# Patient Record
Sex: Female | Born: 2000 | Race: White | Hispanic: No | Smoking: Current every day smoker
Health system: Southern US, Community
[De-identification: ages and names within clinical notes are randomized; demographics above are authoritative.]

## PROBLEM LIST (undated history)

## (undated) DIAGNOSIS — F329 Major depressive disorder, single episode, unspecified: Secondary | ICD-10-CM

## (undated) DIAGNOSIS — G43909 Migraine, unspecified, not intractable, without status migrainosus: Secondary | ICD-10-CM

## (undated) DIAGNOSIS — F603 Borderline personality disorder: Secondary | ICD-10-CM

## (undated) DIAGNOSIS — T50902A Poisoning by unspecified drugs, medicaments and biological substances, intentional self-harm, initial encounter: Secondary | ICD-10-CM

## (undated) HISTORY — PX: APPENDECTOMY: SHX54

---

## 2018-12-06 ENCOUNTER — Emergency Department: Payer: Medicaid - Out of State

## 2018-12-06 ENCOUNTER — Encounter: Payer: Self-pay | Admitting: Emergency Medicine

## 2018-12-06 ENCOUNTER — Other Ambulatory Visit: Payer: Self-pay

## 2018-12-06 ENCOUNTER — Inpatient Hospital Stay
Admission: EM | Admit: 2018-12-06 | Discharge: 2018-12-08 | DRG: 101 | Disposition: A | Discharge status: Home / Self Care | Payer: Medicaid - Out of State | Attending: Internal Medicine | Admitting: Internal Medicine

## 2018-12-06 DIAGNOSIS — Z1159 Encounter for screening for other viral diseases: Secondary | ICD-10-CM

## 2018-12-06 DIAGNOSIS — E236 Other disorders of pituitary gland: Secondary | ICD-10-CM

## 2018-12-06 DIAGNOSIS — F17200 Nicotine dependence, unspecified, uncomplicated: Secondary | ICD-10-CM | POA: Diagnosis present

## 2018-12-06 DIAGNOSIS — G4089 Other seizures: Principal | ICD-10-CM | POA: Diagnosis present

## 2018-12-06 DIAGNOSIS — F319 Bipolar disorder, unspecified: Secondary | ICD-10-CM | POA: Diagnosis present

## 2018-12-06 DIAGNOSIS — R569 Unspecified convulsions: Secondary | ICD-10-CM

## 2018-12-06 LAB — CBC WITH DIFFERENTIAL/PLATELET
Abs Immature Granulocytes: 0.03 10*3/uL (ref 0.00–0.07)
Basophils Absolute: 0 10*3/uL (ref 0.0–0.1)
Basophils Relative: 0 %
Eosinophils Absolute: 0.1 10*3/uL (ref 0.0–0.5)
Eosinophils Relative: 1 %
HCT: 38.6 % (ref 36.0–46.0)
Hemoglobin: 12.8 g/dL (ref 12.0–15.0)
Immature Granulocytes: 0 %
Lymphocytes Relative: 21 %
Lymphs Abs: 2.2 10*3/uL (ref 0.7–4.0)
MCH: 31.4 pg (ref 26.0–34.0)
MCHC: 33.2 g/dL (ref 30.0–36.0)
MCV: 94.6 fL (ref 80.0–100.0)
Monocytes Absolute: 0.6 10*3/uL (ref 0.1–1.0)
Monocytes Relative: 6 %
Neutro Abs: 7.1 10*3/uL (ref 1.7–7.7)
Neutrophils Relative %: 72 %
Platelets: 200 10*3/uL (ref 150–400)
RBC: 4.08 MIL/uL (ref 3.87–5.11)
RDW: 12.1 % (ref 11.5–15.5)
WBC: 10.1 10*3/uL (ref 4.0–10.5)
nRBC: 0 % (ref 0.0–0.2)

## 2018-12-06 LAB — URINALYSIS, COMPLETE (UACMP) WITH MICROSCOPIC
Bilirubin Urine: NEGATIVE
Glucose, UA: NEGATIVE mg/dL
Hgb urine dipstick: NEGATIVE
Ketones, ur: NEGATIVE mg/dL
Leukocytes,Ua: NEGATIVE
Nitrite: NEGATIVE
Protein, ur: NEGATIVE mg/dL
Specific Gravity, Urine: 1.02 (ref 1.005–1.030)
WBC, UA: NONE SEEN WBC/hpf (ref 0–5)
pH: 7 (ref 5.0–8.0)

## 2018-12-06 LAB — COMPREHENSIVE METABOLIC PANEL
ALT: 13 U/L (ref 0–44)
AST: 16 U/L (ref 15–41)
Albumin: 4 g/dL (ref 3.5–5.0)
Alkaline Phosphatase: 75 U/L (ref 38–126)
Anion gap: 7 (ref 5–15)
BUN: 16 mg/dL (ref 6–20)
CO2: 26 mmol/L (ref 22–32)
Calcium: 9.1 mg/dL (ref 8.9–10.3)
Chloride: 105 mmol/L (ref 98–111)
Creatinine, Ser: 0.62 mg/dL (ref 0.44–1.00)
GFR calc Af Amer: >60 mL/min (ref >60)
GFR calc non Af Amer: >60 mL/min (ref >60)
Glucose, Bld: 107 mg/dL — ABNORMAL HIGH (ref 70–99)
Potassium: 3.7 mmol/L (ref 3.5–5.1)
Sodium: 138 mmol/L (ref 135–145)
Total Bilirubin: 0.4 mg/dL (ref 0.3–1.2)
Total Protein: 7.2 g/dL (ref 6.5–8.1)

## 2018-12-06 LAB — POCT PREGNANCY, URINE: Preg Test, Ur: NEGATIVE

## 2018-12-06 NOTE — ED Notes (Signed)
IV attempt to rt hand and left a/c without success; pt tolerated well; will have 2nd RN examine for access; pt voices good understanding and agreeance

## 2018-12-06 NOTE — ED Triage Notes (Signed)
Pt to room 3 via EMS from home; stands and tx self to stretcher; EMS st per household member--witness 5-30min seizure activity; no hx of such

## 2018-12-06 NOTE — ED Provider Notes (Signed)
[facility] Emergency Department Provider Note   ____________________________________________   First MD Initiated Contact with Patient 12/06/18 2356     (approximate)  I have reviewed the triage vital signs and the nursing notes.   HISTORY  Chief Complaint Seizure  Level V caveat: Limited by postictal state  HPI Natalie Pham is a 18 y.o. female brought to the ED from home via EMS status post seizure.  Per patient's girlfriend who witnessed the event, they were relaxing on the couch when patient complained of not feeling well and had a series of tonic-clonic seizures lasting approximately 5 minutes.  Patient was postictal afterwards but by the time EMS arrived girlfriend states patient was in her baseline state.  Patient does not have a history of seizure disorder.  Girlfriend states a similar episode occurred last week during patient's work.  She was on face time with the patient at the time when she experienced a several minute episode of tonic-clonic seizure.  Her coworkers lowered her to the floor.  She was evaluated by the ED in [place] and diagnosed with complex migraine.  Patient states that she has never been diagnosed with migraines previously.  Denies recent fever, cough, chest pain, shortness of breath, abdominal pain, nausea or vomiting.  Currently has slight frontal headache.  Denies recent travel, trauma or exposure to persons diagnosed with coronavirus.       Past medical history None  There are no active problems to display for this patient.    Prior to Admission medications   Not on File    Allergies Abilify [aripiprazole]  No family history on file.  Social History Social History   Tobacco Use  . Smoking status: Current Every Day Smoker  . Smokeless tobacco: Never Used  Substance Use Topics  . Alcohol use: Never    Frequency: Never  . Drug use: Never  Denies illicit drug use  Review of Systems  Constitutional: No  fever/chills Eyes: No visual changes. ENT: No sore throat. Cardiovascular: Denies chest pain. Respiratory: Denies shortness of breath. Gastrointestinal: No abdominal pain.  No nausea, no vomiting.  No diarrhea.  No constipation. Genitourinary: Negative for dysuria. Musculoskeletal: Negative for back pain. Skin: Negative for rash. Neurological: Positive for headache. Negative for focal weakness or numbness.   ____________________________________________   PHYSICAL EXAM:  VITAL SIGNS: ED Triage Vitals  Enc Vitals Group     BP 12/06/18 2155 (!) 155/78     Pulse Rate 12/06/18 2155 75     Resp 12/06/18 2200 18     Temp 12/06/18 2155 98.3 F (36.8 C)     Temp Source 12/06/18 2155 Oral     SpO2 12/06/18 2155 99 %     Weight 12/06/18 2156 225 lb (102.1 kg)     Height 12/06/18 2156 5\' 2"  (1.575 m)     Head Circumference --      Peak Flow --      Pain Score 12/06/18 2158 7     Pain Loc --      Pain Edu? --      Excl. in GC? --     Constitutional: Alert and oriented. Well appearing and in no acute distress. Eyes: Conjunctivae are normal. PERRL. EOMI. Head: Atraumatic. Nose: Atraumatic. Mouth/Throat: Mucous membranes are moist.  No dental malocclusion. Neck: No stridor.  No cervical spine tenderness to palpation.  No carotid bruits. Cardiovascular: Normal rate, regular rhythm. Grossly normal heart sounds.  Good peripheral circulation. Respiratory: Normal respiratory effort.  No retractions. Lungs CTAB. Gastrointestinal: Soft and nontender. No distention. No abdominal bruits. No CVA tenderness. Musculoskeletal: No lower extremity tenderness nor edema.  No joint effusions. Neurologic: Alert and oriented x3.  CN II-XII grossly intact. Normal speech and language. No gross focal neurologic deficits are appreciated.  Skin:  Skin is warm, dry and intact. No rash noted. No petechiae. Psychiatric: Mood and affect are normal. Speech and behavior are normal.   ____________________________________________   LABS (all labs ordered are listed, but only abnormal results are displayed)  Labs Reviewed  URINALYSIS, COMPLETE (UACMP) WITH MICROSCOPIC - Abnormal; Notable for the following components:      Result Value   Color, Urine YELLOW (*)    APPearance CLOUDY (*)    Bacteria, UA RARE (*)    All other components within normal limits  COMPREHENSIVE METABOLIC PANEL - Abnormal; Notable for the following components:   Glucose, Bld 107 (*)    All other components within normal limits  SARS CORONAVIRUS 2 (HOSPITAL ORDER, PERFORMED IN [facility])  CBC WITH DIFFERENTIAL/PLATELET  URINE DRUG SCREEN, QUALITATIVE ([facility] ONLY)  POC URINE PREG, ED  POCT PREGNANCY, URINE   ____________________________________________  EKG  ED ECG REPORT I, SUNG,JADE J, the attending physician, personally viewed and interpreted this ECG.   Date: 12/07/2018  EKG Time: 2202  Rate: 91  Rhythm: normal EKG, normal sinus rhythm  Axis: Normal  Intervals:none  ST&T Change: Nonspecific  ____________________________________________  RADIOLOGY  ED MD interpretation: No ICH, empty sella turcica  Official radiology report(s): Ct Head Wo Contrast  Result Date: 12/06/2018 CLINICAL DATA:  18 y/o  F; Seizure, new, nontraumatic, 18-40 yrs. EXAM: CT HEAD WITHOUT CONTRAST TECHNIQUE: Contiguous axial images were obtained from the base of the skull through the vertex without intravenous contrast. COMPARISON:  None. FINDINGS: Brain: No evidence of acute infarction, hemorrhage, hydrocephalus, extra-axial collection or mass lesion/mass effect. Empty sella turcica. Vascular: No hyperdense vessel or unexpected calcification. Skull: Normal. Negative for fracture or focal lesion. Sinuses/Orbits: No acute finding. Other: None. IMPRESSION: No acute intracranial abnormality identified. Empty sella turcica. Otherwise unremarkable CT of the head. Electronically Signed   By: Mitzi Hansen M.D.   On: 12/06/2018 22:53    ____________________________________________   PROCEDURES  Procedure(s) performed (including Critical Care):  Procedures   ____________________________________________   INITIAL IMPRESSION / ASSESSMENT AND PLAN / ED COURSE  As part of my medical decision making, I reviewed the following data within the electronic MEDICAL RECORD NUMBER History obtained from family, Nursing notes reviewed and incorporated, Labs reviewed, EKG interpreted, Old chart reviewed, Radiograph reviewed, Discussed with admitting physician Dr. Arville Care and Notes from prior ED visits     Natalie Pham was evaluated in Emergency Department on 12/07/2018 for the symptoms described in the history of present illness. She was evaluated in the context of the global COVID-19 pandemic, which necessitated consideration that the patient might be at risk for infection with the SARS-CoV-2 virus that causes COVID-19. Institutional protocols and algorithms that pertain to the evaluation of patients at risk for COVID-19 are in a state of rapid change based on information released by regulatory bodies including the CDC and federal and state organizations. These policies and algorithms were followed during the patient's care in the ED.   18 year old female who presents with new onset seizures, now twice in 2 weeks. Differential diagnosis includes, but is not limited to, intracranial hemorrhage, meningitis/encephalitis, previous head trauma, cavernous venous thrombosis, tension headache, temporal arteritis, migraine or migraine equivalent,  idiopathic intracranial hypertension, and substance use.  Laboratory results unremarkable.  CT finding of empty sella turcica.  Given patient has no prior history of seizures, and now has had 2 seizures in 2 weeks, will discuss with hospitalist to evaluate emergency department for admission for further work-up.      ____________________________________________    FINAL CLINICAL IMPRESSION(S) / ED DIAGNOSES  Final diagnoses:  Seizure (HCC)  Empty sella [facility](HCC)     ED Discharge Orders    None       Note:  This document was prepared using Dragon voice recognition software and may include unintentional dictation errors.   Irean HongSung, Jade J, MD 12/07/18 [phone]

## 2018-12-06 NOTE — ED Notes (Signed)
Pt to CT via stretcher accomp by CT tech 

## 2018-12-06 NOTE — ED Notes (Addendum)
Pt assisted into hosp gown & on card monitor; pt reports no memory of incident PTA; denies any recent illness; st she awoke with people standing over her; c/o generalized HA accomp by photosensitivity and dizziness; denies hx of same; pt reports her roommate is a nurse and she was told that she thought she had a seizure; pt A&Ox3, PERRL, MAEW, grips = & strong; resp even/unlab, lungs clear, apical audible & regular, +BS, abd soft/nondist, strong & = periph pulses

## 2018-12-07 ENCOUNTER — Inpatient Hospital Stay: Payer: Medicaid - Out of State

## 2018-12-07 ENCOUNTER — Encounter: Payer: Self-pay | Admitting: Emergency Medicine

## 2018-12-07 DIAGNOSIS — F17200 Nicotine dependence, unspecified, uncomplicated: Secondary | ICD-10-CM | POA: Diagnosis present

## 2018-12-07 DIAGNOSIS — R569 Unspecified convulsions: Secondary | ICD-10-CM | POA: Diagnosis not present

## 2018-12-07 DIAGNOSIS — G4089 Other seizures: Secondary | ICD-10-CM | POA: Diagnosis present

## 2018-12-07 DIAGNOSIS — E236 Other disorders of pituitary gland: Secondary | ICD-10-CM | POA: Diagnosis present

## 2018-12-07 DIAGNOSIS — Z1159 Encounter for screening for other viral diseases: Secondary | ICD-10-CM | POA: Diagnosis not present

## 2018-12-07 DIAGNOSIS — F319 Bipolar disorder, unspecified: Secondary | ICD-10-CM | POA: Diagnosis present

## 2018-12-07 LAB — URINE DRUG SCREEN, QUALITATIVE (ARMC ONLY)
Amphetamines, Ur Screen: NOT DETECTED
Barbiturates, Ur Screen: NOT DETECTED
Benzodiazepine, Ur Scrn: NOT DETECTED
Cannabinoid 50 Ng, Ur ~~LOC~~: NOT DETECTED
Cocaine Metabolite,Ur ~~LOC~~: NOT DETECTED
MDMA (Ecstasy)Ur Screen: NOT DETECTED
Methadone Scn, Ur: NOT DETECTED
Opiate, Ur Screen: NOT DETECTED
Phencyclidine (PCP) Ur S: NOT DETECTED
Tricyclic, Ur Screen: NOT DETECTED

## 2018-12-07 LAB — MAGNESIUM: Magnesium: 2 mg/dL (ref 1.7–2.4)

## 2018-12-07 LAB — CBC
HCT: 37.3 % (ref 36.0–46.0)
Hemoglobin: 12.1 g/dL (ref 12.0–15.0)
MCH: 30.9 pg (ref 26.0–34.0)
MCHC: 32.4 g/dL (ref 30.0–36.0)
MCV: 95.4 fL (ref 80.0–100.0)
Platelets: 194 10*3/uL (ref 150–400)
RBC: 3.91 MIL/uL (ref 3.87–5.11)
RDW: 12.1 % (ref 11.5–15.5)
WBC: 9.1 10*3/uL (ref 4.0–10.5)
nRBC: 0 % (ref 0.0–0.2)

## 2018-12-07 LAB — BASIC METABOLIC PANEL
Anion gap: 8 (ref 5–15)
BUN: 16 mg/dL (ref 6–20)
CO2: 22 mmol/L (ref 22–32)
Calcium: 8.6 mg/dL — ABNORMAL LOW (ref 8.9–10.3)
Chloride: 108 mmol/L (ref 98–111)
Creatinine, Ser: 0.54 mg/dL (ref 0.44–1.00)
GFR calc Af Amer: >60 mL/min (ref >60)
GFR calc non Af Amer: >60 mL/min (ref >60)
Glucose, Bld: 108 mg/dL — ABNORMAL HIGH (ref 70–99)
Potassium: 3.7 mmol/L (ref 3.5–5.1)
Sodium: 138 mmol/L (ref 135–145)

## 2018-12-07 LAB — SARS CORONAVIRUS 2 BY RT PCR (HOSPITAL ORDER, PERFORMED IN ~~LOC~~ HOSPITAL LAB): SARS Coronavirus 2: NEGATIVE

## 2018-12-07 LAB — PHOSPHORUS: Phosphorus: 4.2 mg/dL (ref 2.5–4.6)

## 2018-12-07 MED ORDER — ENOXAPARIN SODIUM 40 MG/0.4ML ~~LOC~~ SOLN: 40.0000 mg | SUBCUTANEOUS | Status: DC

## 2018-12-07 MED ORDER — SODIUM CHLORIDE 0.9 % IV SOLN
INTRAVENOUS | Status: DC
  Administered 2018-12-07: 03:00:00 via INTRAVENOUS

## 2018-12-07 MED ORDER — LORAZEPAM 2 MG/ML IJ SOLN: 1.0000 mg | Freq: Two times a day (BID) | INTRAMUSCULAR | Status: DC | PRN

## 2018-12-07 MED ORDER — ENOXAPARIN SODIUM 40 MG/0.4ML ~~LOC~~ SOLN: 40.0000 mg | Freq: Two times a day (BID) | SUBCUTANEOUS | Status: DC

## 2018-12-07 MED ORDER — LEVETIRACETAM 500 MG PO TABS
500.0000 mg | ORAL_TABLET | Freq: Two times a day (BID) | ORAL | Status: DC
  Administered 2018-12-07 – 2018-12-08 (×2): 500 mg via ORAL
  Filled 2018-12-07 (×3): qty 1

## 2018-12-07 MED ORDER — TRAZODONE HCL 50 MG PO TABS
25.0000 mg | ORAL_TABLET | Freq: Every evening | ORAL | Status: DC | PRN
  Administered 2018-12-07: 21:00:00 25 mg via ORAL
  Filled 2018-12-07: qty 1

## 2018-12-07 MED ORDER — ACETAMINOPHEN 325 MG PO TABS
650.0000 mg | ORAL_TABLET | Freq: Four times a day (QID) | ORAL | Status: DC | PRN
  Administered 2018-12-07: 03:00:00 650 mg via ORAL
  Filled 2018-12-07: qty 2

## 2018-12-07 MED ORDER — LEVETIRACETAM IN NACL 500 MG/100ML IV SOLN
500.0000 mg | Freq: Once | INTRAVENOUS | Status: AC
  Administered 2018-12-07: 12:00:00 500 mg via INTRAVENOUS
  Filled 2018-12-07: qty 100

## 2018-12-07 MED ORDER — LORAZEPAM 2 MG/ML IJ SOLN
0.5000 mg | Freq: Once | INTRAMUSCULAR | Status: AC
  Administered 2018-12-07: 0.5 mg via INTRAVENOUS
  Filled 2018-12-07: qty 1

## 2018-12-07 MED ORDER — ONDANSETRON HCL 4 MG/2ML IJ SOLN: 4.0000 mg | Freq: Four times a day (QID) | INTRAMUSCULAR | Status: DC | PRN

## 2018-12-07 MED ORDER — ACETAMINOPHEN 650 MG RE SUPP: 650.0000 mg | Freq: Four times a day (QID) | RECTAL | Status: DC | PRN

## 2018-12-07 MED ORDER — MAGNESIUM HYDROXIDE 400 MG/5ML PO SUSP
30.0000 mL | Freq: Every day | ORAL | Status: DC | PRN
  Filled 2018-12-07: qty 30

## 2018-12-07 MED ORDER — SODIUM CHLORIDE 0.9 % IV BOLUS
500.0000 mL | Freq: Once | INTRAVENOUS | Status: AC
  Administered 2018-12-07: 500 mL via INTRAVENOUS

## 2018-12-07 MED ORDER — ONDANSETRON HCL 4 MG PO TABS: 4.0000 mg | ORAL_TABLET | Freq: Four times a day (QID) | ORAL | Status: DC | PRN

## 2018-12-07 MED ORDER — VENLAFAXINE HCL ER 37.5 MG PO CP24
37.5000 mg | ORAL_CAPSULE | Freq: Every day | ORAL | Status: DC
  Administered 2018-12-07 – 2018-12-08 (×2): 37.5 mg via ORAL
  Filled 2018-12-07 (×2): qty 1

## 2018-12-07 NOTE — ED Notes (Signed)
hospitalist in to see pt.

## 2018-12-07 NOTE — Consult Note (Signed)
Reason for Consult:Seizures Referring Physician: Allena Katz  CC: Seizures  HPI: Natalie Pham is an 18 y.o. female with a history of bipolar disease and PTSD who is amnestic of most of the events leading to her hospitalization.  Per chart patient was relaxing with girlfriend on the couch when patient complained of not feeling well and had a series of tonic-clonic seizures lasting approximately 5 minutes.  There was no tongue biting or bowel/bladder incontinence per patient.  Patient was postictal afterwards but by the time EMS arrived girlfriend stated patient was in her baseline state.  Patient does not have a history of seizure disorder.  Patient reports that about a week ago she had a 15 minutes episode of being unresponsive at her job.  She  Reports asking her co-workers what happened and that they would not tell her.  Her girlfriend reported though that she was on face time with the patient at the time when she experienced a several minute episode of tonic-clonic seizure.  Her coworkers lowered her to the floor.  She was evaluated by the ED in [place] and diagnosed with complex migraine and was not started on anticonvulsant therapy.   Patient admits to significant current stressors.  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    History reviewed. No pertinent past medical history.  Past Surgical History:  Procedure Laterality Date  . APPENDECTOMY      Family history: No family history of seizures  Social History:  reports that she has been smoking. She has never used smokeless tobacco. She reports that she does not drink alcohol or use drugs.  Allergies  Allergen Reactions  . Abilify [Aripiprazole] Rash    Medications:  I have reviewed the patient's current medications. Prior to Admission:  Medications Prior to Admission  Medication Sig Dispense Refill Last Dose  . Desvenlafaxine Succinate ER (PRISTIQ) 25 MG TB24 Take 1 tablet by mouth daily.   12/06/2018 at 0800  . hydrOXYzine (ATARAX/VISTARIL) 25  MG tablet Take 25 mg by mouth 3 (three) times daily as needed.   prn at prn  . triamcinolone cream (KENALOG) 0.5 % Apply 1 application topically 2 (two) times daily. Use 1 application twice daily for 14 days on affected area.   12/06/2018 at 0800   Scheduled: . enoxaparin (LOVENOX) injection  40 mg Subcutaneous Q12H  . venlafaxine XR  37.5 mg Oral Q breakfast    ROS: History obtained from the patient  General ROS: negative for - chills, fatigue, fever, night sweats, weight gain or weight loss Psychological ROS: negative for - behavioral disorder, hallucinations, memory difficulties, mood swings or suicidal ideation Ophthalmic ROS: negative for - blurry vision, double vision, eye pain or loss of vision ENT ROS: negative for - epistaxis, nasal discharge, oral lesions, sore throat, tinnitus or vertigo Allergy and Immunology ROS: negative for - hives or itchy/watery eyes Hematological and Lymphatic ROS: negative for - bleeding problems, bruising or swollen lymph nodes Endocrine ROS: negative for - galactorrhea, hair pattern changes, polydipsia/polyuria or temperature intolerance Respiratory ROS: negative for - cough, hemoptysis, shortness of breath or wheezing Cardiovascular ROS: negative for - chest pain, dyspnea on exertion, edema or irregular heartbeat Gastrointestinal ROS: negative for - abdominal pain, diarrhea, hematemesis, nausea/vomiting or stool incontinence Genito-Urinary ROS: negative for - dysuria, hematuria, incontinence or urinary frequency/urgency Musculoskeletal ROS: negative for - joint swelling or muscular weakness Neurological ROS: as noted in HPI Dermatological ROS: negative for rash and skin lesion changes  Physical Examination: Blood pressure 115/60, pulse  77, temperature 98.1 F (36.7 C), temperature source Oral, resp. rate 17, height 5\' 2"  (1.575 m), weight 102.1 kg, last menstrual period 11/06/2018, SpO2 100 %.  HEENT-  Normocephalic, no lesions, without obvious  abnormality.  Normal external eye and conjunctiva.  Normal TM's bilaterally.  Normal auditory canals and external ears. Normal external nose, mucus membranes and septum.  Normal pharynx. Cardiovascular- S1, S2 normal, pulses palpable throughout   Lungs- chest clear, no wheezing, rales, normal symmetric air entry Abdomen- soft, non-tender; bowel sounds normal; no masses,  no organomegaly Extremities- mild LE edema Lymph-no adenopathy palpable Musculoskeletal-no joint tenderness, deformity or swelling Skin-warm and dry, no hyperpigmentation, vitiligo, or suspicious lesions  Neurological Examination   Mental Status: Alert, oriented, thought content appropriate.  Speech fluent without evidence of aphasia.  Able to follow 3 step commands without difficulty. Cranial Nerves: II: Discs flat bilaterally; Visual fields grossly normal, pupils equal, round, reactive to light and accommodation III,IV, VI: ptosis not present, extra-ocular motions intact bilaterally V,VII: smile symmetric, facial light touch sensation normal bilaterally VIII: hearing normal bilaterally IX,X: gag reflex present XI: bilateral shoulder shrug XII: midline tongue extension Motor: Right : Upper extremity   5/5    Left:     Upper extremity   5/5  Lower extremity   5/5     Lower extremity   5/5 Tone and bulk:normal tone throughout; no atrophy noted Sensory: Pinprick and light touch intact throughout, bilaterally Deep Tendon Reflexes: Symmetric throughout Plantars: Right: downgoing   Left: downgoing Cerebellar: Normal finger-to-nose and normal heel-to-shin testing bilaterally Gait: not tested due to safety concerns   Laboratory Studies:   Basic Metabolic Panel: Recent Labs  Lab 12/06/18 2211 12/07/18 0443  NA 138 138  K 3.7 3.7  CL 105 108  CO2 26 22  GLUCOSE 107* 108*  BUN 16 16  CREATININE 0.62 0.54  CALCIUM 9.1 8.6*    Liver Function Tests: Recent Labs  Lab 12/06/18 2211  AST 16  ALT 13  ALKPHOS 75   BILITOT 0.4  PROT 7.2  ALBUMIN 4.0   No results for input(s): LIPASE, AMYLASE in the last 168 hours. No results for input(s): AMMONIA in the last 168 hours.  CBC: Recent Labs  Lab 12/06/18 2211 12/07/18 0443  WBC 10.1 9.1  NEUTROABS 7.1  --   HGB 12.8 12.1  HCT 38.6 37.3  MCV 94.6 95.4  PLT 200 194    Cardiac Enzymes: No results for input(s): CKTOTAL, CKMB, CKMBINDEX, TROPONINI in the last 168 hours.  BNP: Invalid input(s): POCBNP  CBG: No results for input(s): GLUCAP in the last 168 hours.  Microbiology: Results for orders placed or performed during the hospital encounter of 12/06/18  SARS Coronavirus 2 (CEPHEID - Performed in [facility] hospital lab), Hosp Order     Status: None   Collection Time: 12/07/18 12:40 AM  Result Value Ref Range Status   SARS Coronavirus 2 NEGATIVE NEGATIVE Final    Comment: (NOTE) If result is NEGATIVE SARS-CoV-2 target nucleic acids are NOT DETECTED. The SARS-CoV-2 RNA is generally detectable in upper and lower  respiratory specimens during the acute phase of infection. The lowest  concentration of SARS-CoV-2 viral copies this assay can detect is 250  copies / mL. A negative result does not preclude SARS-CoV-2 infection  and should not be used as the sole basis for treatment or other  patient management decisions.  A negative result may occur with  improper specimen collection / handling, submission of specimen other  than nasopharyngeal swab, presence of viral mutation(s) within the  areas targeted by this assay, and inadequate number of viral copies  (<250 copies / mL). A negative result must be combined with clinical  observations, patient history, and epidemiological information. If result is POSITIVE SARS-CoV-2 target nucleic acids are DETECTED. The SARS-CoV-2 RNA is generally detectable in upper and lower  respiratory specimens dur ing the acute phase of infection.  Positive  results are indicative of active infection with  SARS-CoV-2.  Clinical  correlation with patient history and other diagnostic information is  necessary to determine patient infection status.  Positive results do  not rule out bacterial infection or co-infection with other viruses. If result is PRESUMPTIVE POSTIVE SARS-CoV-2 nucleic acids MAY BE PRESENT.   A presumptive positive result was obtained on the submitted specimen  and confirmed on repeat testing.  While 2019 novel coronavirus  (SARS-CoV-2) nucleic acids may be present in the submitted sample  additional confirmatory testing may be necessary for epidemiological  and / or clinical management purposes  to differentiate between  SARS-CoV-2 and other Sarbecovirus currently known to infect humans.  If clinically indicated additional testing with an alternate test  methodology [phone]) is advised. The SARS-CoV-2 RNA is generally  detectable in upper and lower respiratory sp ecimens during the acute  phase of infection. The expected result is Negative. Fact Sheet for Patients:  BoilerBrush.com.cy Fact Sheet for Healthcare Providers: https://pope.com/ This test is not yet approved or cleared by the [place] FDA and has been authorized for detection and/or diagnosis of SARS-CoV-2 by FDA under an Emergency Use Authorization (EUA).  This EUA will remain in effect (meaning this test can be used) for the duration of the COVID-19 declaration under Section 564(b)(1) of the Act, 21 U.S.C. section 360bbb-3(b)(1), unless the authorization is terminated or revoked sooner. Performed at [facility], [address] [place], [place] [address]     Coagulation Studies: No results for input(s): LABPROT, INR in the last 72 hours.  Urinalysis:  Recent Labs  Lab 12/06/18 2211  COLORURINE YELLOW*  LABSPEC 1.020  PHURINE 7.0  GLUCOSEU NEGATIVE  HGBUR NEGATIVE  BILIRUBINUR NEGATIVE  KETONESUR NEGATIVE  PROTEINUR NEGATIVE   NITRITE NEGATIVE  LEUKOCYTESUR NEGATIVE    Lipid Panel:  No results found for: CHOL, TRIG, HDL, CHOLHDL, VLDL, LDLCALC  HgbA1C: No results found for: HGBA1C  Urine Drug Screen:      Component Value Date/Time   LABOPIA NONE DETECTED 12/06/2018 2211   COCAINSCRNUR NONE DETECTED 12/06/2018 2211   LABBENZ NONE DETECTED 12/06/2018 2211   AMPHETMU NONE DETECTED 12/06/2018 2211   THCU NONE DETECTED 12/06/2018 2211   LABBARB NONE DETECTED 12/06/2018 2211    Alcohol Level: No results for input(s): ETH in the last 168 hours.  Other results: EKG: sinus rhythm at 91 bpm  Imaging: Ct Head Wo Contrast  Result Date: 12/06/2018 CLINICAL DATA:  18 y/o  F; Seizure, new, nontraumatic, 18-40 yrs. EXAM: CT HEAD WITHOUT CONTRAST TECHNIQUE: Contiguous axial images were obtained from the base of the skull through the vertex without intravenous contrast. COMPARISON:  None. FINDINGS: Brain: No evidence of acute infarction, hemorrhage, hydrocephalus, extra-axial collection or mass lesion/mass effect. Empty sella turcica. Vascular: No hyperdense vessel or unexpected calcification. Skull: Normal. Negative for fracture or focal lesion. Sinuses/Orbits: No acute finding. Other: None. IMPRESSION: No acute intracranial abnormality identified. Empty sella turcica. Otherwise unremarkable CT of the head. Electronically Signed   By: Mitzi Hansen M.D.   On: 12/06/2018 22:53  Mr Brain 33 Contrast  Result Date: 12/07/2018 CLINICAL DATA:  18 year old female with witnessed seizure activity, 1st seizure. EXAM: MRI HEAD WITHOUT CONTRAST TECHNIQUE: Multiplanar, multiecho pulse sequences of the brain and surrounding structures were obtained without intravenous contrast. COMPARISON:  Head CT 12/06/2018. FINDINGS: Brain: No restricted diffusion to suggest acute infarction. No midline shift, mass effect, evidence of mass lesion, ventriculomegaly, extra-axial collection or acute intracranial hemorrhage. Cervicomedullary  junction and pituitary are within normal limits. On thin slice coronal images the hippocampal formations appear symmetric and within normal limits (series 10, image 14). Wallace Cullens and white matter signal is within normal limits throughout the brain. No encephalomalacia or chronic cerebral blood products identified. Vascular: Major intracranial vascular flow voids are grossly preserved, the distal left vertebral artery appears dominant. Skull and upper cervical spine: Negative visible cervical spine. Visualized bone marrow signal is within normal limits. Sinuses/Orbits: Negative orbits. Paranasal sinuses and mastoids are stable and well pneumatized. Other: Visible internal auditory structures appear normal. Scalp and face soft tissues appear negative. IMPRESSION: Normal noncontrast MRI appearance of the brain. Electronically Signed   By: Odessa Fleming M.D.   On: 12/07/2018 02:05     Assessment/Plan: 18 year old female with a history of bipolar disease and PTSD but no history of seizures presenting with multiple seizure-like events after having had a similar presentation about a week ago.  Patient now at baseline.  MRI of the brain reviewed and is normal.  Lab work unremarkable.    Recommendations: 1. After EEG today would give  IV of Keppra and patient may continue Keppra at  BID po at discharge 2. Continue seizure precautions 3. Serum magnesium and phosphorus 4. Patient unable to drive, operate heavy machinery, perform activities at heights and participate in water activities until release by outpatient physician.   5. Patient to follow up with neurology on an outpatient basis.    Thana Farr, MD Neurology [phone] 12/07/2018, 10:35 AM

## 2018-12-07 NOTE — Progress Notes (Signed)
Lovenox dose adjustment. Patient admitted for possibility of seizures, CT head negative. CrCl 126 ml/min (overestimate d/t patient's body habitus) BMI 41   Will increase lovenox dose from 40 mg subq daily to 40 mg subq bid for BMI > 40 and CrCl > 30 ml/min  Thomasene Ripple, PharmD, BCPS Clinical Pharmacist 12/07/2018

## 2018-12-07 NOTE — Progress Notes (Signed)
MEDICATION RELATED CONSULT NOTE - INITIAL   Pharmacy Consult for drug-drug interactions Indication: initiation of anti-epileptic therapy  Allergies  Allergen Reactions  . Abilify [Aripiprazole] Rash    Patient Measurements: Height: 5\' 2"  (157.5 cm) Weight: 225 lb (102.1 kg) IBW/kg (Calculated) : 50.1 Adjusted Body Weight: 80 kg  Vital Signs: Temp: 97.8 F (36.6 C) (05/21 0205) Temp Source: Oral (05/21 0205) BP: 123/75 (05/21 0205) Pulse Rate: 75 (05/21 0205) Intake/Output from previous day: 05/20 0701 - 05/21 0700 In: 500 [IV Piggyback:500] Out: -  Intake/Output from this shift: Total I/O In: 500 [IV Piggyback:500] Out: -   Labs: Recent Labs    12/06/18 2211  WBC 10.1  HGB 12.8  HCT 38.6  PLT 200  CREATININE 0.62  ALBUMIN 4.0  PROT 7.2  AST 16  ALT 13  ALKPHOS 75  BILITOT 0.4   Estimated Creatinine Clearance: 126.6 mL/min (by C-G formula based on SCr of 0.62 mg/dL).   Microbiology: Recent Results (from the past 720 hour(s))  SARS Coronavirus 2 (CEPHEID - Performed in [facility] hospital lab), Hosp Order     Status: None   Collection Time: 12/07/18 12:40 AM  Result Value Ref Range Status   SARS Coronavirus 2 NEGATIVE NEGATIVE Final    Comment: (NOTE) If result is NEGATIVE SARS-CoV-2 target nucleic acids are NOT DETECTED. The SARS-CoV-2 RNA is generally detectable in upper and lower  respiratory specimens during the acute phase of infection. The lowest  concentration of SARS-CoV-2 viral copies this assay can detect is 250  copies / mL. A negative result does not preclude SARS-CoV-2 infection  and should not be used as the sole basis for treatment or other  patient management decisions.  A negative result may occur with  improper specimen collection / handling, submission of specimen other  than nasopharyngeal swab, presence of viral mutation(s) within the  areas targeted by this assay, and inadequate number of viral copies  (<250 copies / mL). A  negative result must be combined with clinical  observations, patient history, and epidemiological information. If result is POSITIVE SARS-CoV-2 target nucleic acids are DETECTED. The SARS-CoV-2 RNA is generally detectable in upper and lower  respiratory specimens dur ing the acute phase of infection.  Positive  results are indicative of active infection with SARS-CoV-2.  Clinical  correlation with patient history and other diagnostic information is  necessary to determine patient infection status.  Positive results do  not rule out bacterial infection or co-infection with other viruses. If result is PRESUMPTIVE POSTIVE SARS-CoV-2 nucleic acids MAY BE PRESENT.   A presumptive positive result was obtained on the submitted specimen  and confirmed on repeat testing.  While 2019 novel coronavirus  (SARS-CoV-2) nucleic acids may be present in the submitted sample  additional confirmatory testing may be necessary for epidemiological  and / or clinical management purposes  to differentiate between  SARS-CoV-2 and other Sarbecovirus currently known to infect humans.  If clinically indicated additional testing with an alternate test  methodology [id number]) is advised. The SARS-CoV-2 RNA is generally  detectable in upper and lower respiratory sp ecimens during the acute  phase of infection. The expected result is Negative. Fact Sheet for Patients:  BoilerBrush.com.cy Fact Sheet for Healthcare Providers: https://pope.com/ This test is not yet approved or cleared by the [place] FDA and has been authorized for detection and/or diagnosis of SARS-CoV-2 by FDA under an Emergency Use Authorization (EUA).  This EUA will remain in effect (meaning this test can be used)  for the duration of the COVID-19 declaration under Section 564(b)(1) of the Act, 21 U.S.C. section 360bbb-3(b)(1), unless the authorization is terminated or revoked sooner. Performed  at [facility], [address] [place], [place] [address]     Medical History: History reviewed. No pertinent past medical history.  Medications:  Scheduled:  . enoxaparin (LOVENOX) injection  40 mg Subcutaneous Q12H  . 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  Goal of Therapy:  Cessation of seizures  Plan:  No anti-epileptic medications currently ordered aside from lorazepam 1 mg IV q12h prn and no significant drug-drug interactions w/ ativan currently at this time. Will continue to monitor.  Thomasene Rippleavid Tobechukwu Emmick, PharmD, BCPS Clinical Pharmacist 12/07/2018

## 2018-12-07 NOTE — ED Notes (Signed)
ED TO INPATIENT HANDOFF REPORT  ED Nurse Name and Phone #: Misty Stanley [phone]   S Name/Age/Gender Natalie Pham 18 y.o. female Room/Bed: ED03A/ED03A  Code Status   Code Status: Full Code  Home/SNF/Other Home Patient oriented to: self, place, time and situation Is this baseline? Yes   Triage Complete: Triage complete  Chief Complaint seizure  Triage Note Pt to room 3 via EMS from home; stands and tx self to stretcher; EMS st per household member--witness 5-57min seizure activity; no hx of such   Allergies Allergies  Allergen Reactions  . Abilify [Aripiprazole] Rash    Level of Care/Admitting Diagnosis ED Disposition    ED Disposition Condition Comment   Admit  H[facility] Area: [facility] [[id number]]  Level of Care: Med-Surg [16]  Covid Evaluation: N/A  Diagnosis: Seizure [facility]) [[id number]]  Admitting Physician: Hannah Beat [[id number]]  Attending Physician: Hannah Beat [[id number]]  Estimated length of stay: past midnight tomorrow  Certification:: I certify this patient will need inpatient services for at least 2 midnights  PT Class (Do Not Modify): Inpatient [101]  PT Acc Code (Do Not Modify): Private [1]       B Medical/Surgery History History reviewed. No pertinent past medical history. Past Surgical History:  Procedure Laterality Date  . APPENDECTOMY       A IV Location/Drains/Wounds Patient Lines/Drains/Airways Status   Active Line/Drains/Airways    Name:   Placement date:   Placement time:   Site:   Days:   Peripheral IV 12/06/18 Left Antecubital   12/06/18    2233    Antecubital   1          Intake/Output Last 24 hours No intake or output data in the 24 hours ending 12/07/18 0059  Labs/Imaging Results for orders placed or performed during the hospital encounter of 12/06/18 (from the past 48 hour(s))  Urinalysis, Complete w Microscopic     Status: Abnormal   Collection Time: 12/06/18 10:11 PM  Result Value Ref Range   Color, Urine  YELLOW (A) YELLOW   APPearance CLOUDY (A) CLEAR   Specific Gravity, Urine 1.020 1.005 - 1.030   pH 7.0 5.0 - 8.0   Glucose, UA NEGATIVE NEGATIVE mg/dL   Hgb urine dipstick NEGATIVE NEGATIVE   Bilirubin Urine NEGATIVE NEGATIVE   Ketones, ur NEGATIVE NEGATIVE mg/dL   Protein, ur NEGATIVE NEGATIVE mg/dL   Nitrite NEGATIVE NEGATIVE   Leukocytes,Ua NEGATIVE NEGATIVE   RBC / HPF 0-5 0 - 5 RBC/hpf   WBC, UA NONE SEEN 0 - 5 WBC/hpf   Bacteria, UA RARE (A) NONE SEEN   Squamous Epithelial / LPF 0-5 0 - 5   Amorphous Crystal PRESENT     Comment: Performed at [facility], [address] [place], [place] [address]  CBC with Differential     Status: None   Collection Time: 12/06/18 10:11 PM  Result Value Ref Range   WBC 10.1 4.0 - 10.5 K/uL   RBC 4.08 3.87 - 5.11 MIL/uL   Hemoglobin 12.8 12.0 - 15.0 g/dL   HCT 10.9 32.3 - 55.7 %   MCV 94.6 80.0 - 100.0 fL   MCH 31.4 26.0 - 34.0 pg   MCHC 33.2 30.0 - 36.0 g/dL   RDW 32.2 02.5 - 42.7 %   Platelets 200 150 - 400 K/uL   nRBC 0.0 0.0 - 0.2 %   Neutrophils Relative % 72 %   Neutro Abs 7.1 1.7 - 7.7 K/uL   Lymphocytes Relative  21 %   Lymphs Abs 2.2 0.7 - 4.0 K/uL   Monocytes Relative 6 %   Monocytes Absolute 0.6 0.1 - 1.0 K/uL   Eosinophils Relative 1 %   Eosinophils Absolute 0.1 0.0 - 0.5 K/uL   Basophils Relative 0 %   Basophils Absolute 0.0 0.0 - 0.1 K/uL   Immature Granulocytes 0 %   Abs Immature Granulocytes 0.03 0.00 - 0.07 K/uL    Comment: Performed at [facility], [address] [place], [place] [address]  Comprehensive metabolic panel     Status: Abnormal   Collection Time: 12/06/18 10:11 PM  Result Value Ref Range   Sodium 138 135 - 145 mmol/L   Potassium 3.7 3.5 - 5.1 mmol/L   Chloride 105 98 - 111 mmol/L   CO2 26 22 - 32 mmol/L   Glucose, Bld 107 (H) 70 - 99 mg/dL   BUN 16 6 - 20 mg/dL   Creatinine, Ser 3.41 0.44 - 1.00 mg/dL   Calcium 9.1 8.9 - 96.2 mg/dL   Total Protein 7.2 6.5 - 8.1 g/dL    Albumin 4.0 3.5 - 5.0 g/dL   AST 16 15 - 41 U/L   ALT 13 0 - 44 U/L   Alkaline Phosphatase 75 38 - 126 U/L   Total Bilirubin 0.4 0.3 - 1.2 mg/dL   GFR calc non Af Amer >60 >60 mL/min   GFR calc Af Amer >60 >60 mL/min   Anion gap 7 5 - 15    Comment: Performed at [facility], [address] [place], [place] [address]  Urine Drug Screen, Qualitative ([facility] only)     Status: None   Collection Time: 12/06/18 10:11 PM  Result Value Ref Range   Tricyclic, Ur Screen NONE DETECTED NONE DETECTED   Amphetamines, Ur Screen NONE DETECTED NONE DETECTED   MDMA (Ecstasy)Ur Screen NONE DETECTED NONE DETECTED   Cocaine Metabolite,Ur [place] NONE DETECTED NONE DETECTED   Opiate, Ur Screen NONE DETECTED NONE DETECTED   Phencyclidine (PCP) Ur S NONE DETECTED NONE DETECTED   Cannabinoid 50 Ng, Ur [place] NONE DETECTED NONE DETECTED   Barbiturates, Ur Screen NONE DETECTED NONE DETECTED   Benzodiazepine, Ur Scrn NONE DETECTED NONE DETECTED   Methadone Scn, Ur NONE DETECTED NONE DETECTED    Comment: (NOTE) Tricyclics + metabolites, urine    Cutoff 1000 ng/mL Amphetamines + metabolites, urine  Cutoff 1000 ng/mL MDMA (Ecstasy), urine              Cutoff 500 ng/mL Cocaine Metabolite, urine          Cutoff 300 ng/mL Opiate + metabolites, urine        Cutoff 300 ng/mL Phencyclidine (PCP), urine         Cutoff 25 ng/mL Cannabinoid, urine                 Cutoff 50 ng/mL Barbiturates + metabolites, urine  Cutoff 200 ng/mL Benzodiazepine, urine              Cutoff 200 ng/mL Methadone, urine                   Cutoff 300 ng/mL The urine drug screen provides only a preliminary, unconfirmed analytical test result and should not be used for non-medical purposes. Clinical consideration and professional judgment should be applied to any positive drug screen result due to possible interfering substances. A more specific alternate chemical method must be used in order to obtain a confirmed analytical  result. Gas  chromatography / mass spectrometry (GC/MS) is the preferred confirmat ory method. Performed at [facility], [address] [place], [place] [address]   Pregnancy, urine POC     Status: None   Collection Time: 12/06/18 10:26 PM  Result Value Ref Range   Preg Test, Ur NEGATIVE NEGATIVE    Comment:        THE SENSITIVITY OF THIS METHODOLOGY IS >24 mIU/mL    Ct Head Wo Contrast  Result Date: 12/06/2018 CLINICAL DATA:  18 y/o  F; Seizure, new, nontraumatic, 18-40 yrs. EXAM: CT HEAD WITHOUT CONTRAST TECHNIQUE: Contiguous axial images were obtained from the base of the skull through the vertex without intravenous contrast. COMPARISON:  None. FINDINGS: Brain: No evidence of acute infarction, hemorrhage, hydrocephalus, extra-axial collection or mass lesion/mass effect. Empty sella turcica. Vascular: No hyperdense vessel or unexpected calcification. Skull: Normal. Negative for fracture or focal lesion. Sinuses/Orbits: No acute finding. Other: None. IMPRESSION: No acute intracranial abnormality identified. Empty sella turcica. Otherwise unremarkable CT of the head. Electronically Signed   By: Mitzi HansenLance  Furusawa-Stratton M.D.   On: 12/06/2018 22:53    Pending Labs Unresulted Labs (From admission, onward)    Start     Ordered   12/07/18 0500  Basic metabolic panel  Tomorrow morning,   STAT     12/07/18 0050   12/07/18 0500  CBC  Tomorrow morning,   STAT     12/07/18 0050   12/07/18 0048  HIV antibody (Routine Testing)  Once,   STAT     12/07/18 0050   12/07/18 0005  SARS Coronavirus 2 (CEPHEID - Performed in [facility] hospital lab), Hosp Order  (Asymptomatic Patients Labs)  Once,   STAT    Question:  Rule Out  Answer:  Yes   12/07/18 0004          Vitals/Pain Today's Vitals   12/06/18 2230 12/06/18 2300 12/06/18 2330 12/07/18 0000  BP: 125/63 131/73 128/68 124/72  Pulse: 91 89 88 91  Resp: 20 (!) 21 (!) 24 17  Temp:      TempSrc:      SpO2: 100% 100% 100% 100%  Weight:       Height:      PainSc:        Isolation Precautions No active isolations  Medications Medications  0.9 %  sodium chloride infusion (has no administration in time range)  acetaminophen (TYLENOL) tablet 650 mg (has no administration in time range)    Or  acetaminophen (TYLENOL) suppository 650 mg (has no administration in time range)  traZODone (DESYREL) tablet 25 mg (has no administration in time range)  magnesium hydroxide (MILK OF MAGNESIA) suspension 30 mL (has no administration in time range)  ondansetron (ZOFRAN) tablet 4 mg (has no administration in time range)    Or  ondansetron (ZOFRAN) injection 4 mg (has no administration in time range)  enoxaparin (LOVENOX) injection 40 mg (has no administration in time range)  sodium chloride 0.9 % bolus 500 mL (500 mLs Intravenous New Bag/Given 12/07/18 0042)  LORazepam (ATIVAN) injection 0.5 mg (0.5 mg Intravenous Given 12/07/18 0042)    Mobility walks Low fall risk   Focused Assessments    R Recommendations: See Admitting Provider Note  Report given to:   Additional Notes: none

## 2018-12-07 NOTE — Progress Notes (Signed)
EEG completed, results pending. 

## 2018-12-07 NOTE — Progress Notes (Signed)
[employer] [facility] [place] [facility]                                                                                                                                                                                  Patient Demographics   Natalie Pham, is a 18 y.o. female, DOB - 2001-01-06, AOZ:[id number]  Admit date - 12/06/2018   Admitting Physician Hannah Beat, MD  Outpatient Primary MD for the patient is System, Pcp Not In   LOS - 0  Subjective: Patient admitted with seizures, no further seizures noted to have her EEG earlier today    Review of Systems:   CONSTITUTIONAL: No documented fever. No fatigue, weakness. No weight gain, no weight loss.  EYES: No blurry or double vision.  ENT: No tinnitus. No postnasal drip. No redness of the oropharynx.  RESPIRATORY: No cough, no wheeze, no hemoptysis. No dyspnea.  CARDIOVASCULAR: No chest pain. No orthopnea. No palpitations. No syncope.  GASTROINTESTINAL: No nausea, no vomiting or diarrhea. No abdominal pain. No melena or hematochezia.  GENITOURINARY: No dysuria or hematuria.  ENDOCRINE: No polyuria or nocturia. No heat or cold intolerance.  HEMATOLOGY: No anemia. No bruising. No bleeding.  INTEGUMENTARY: No rashes. No lesions.  MUSCULOSKELETAL: No arthritis. No swelling. No gout.  NEUROLOGIC: No numbness, tingling, or ataxia. No seizure-type activity.  PSYCHIATRIC: No anxiety. No insomnia. No ADD.    Vitals:   Vitals:   12/07/18 0000 12/07/18 0100 12/07/18 0205 12/07/18 1008  BP: 124/72 110/60 123/75 115/60  Pulse: 91 78 75 77  Resp: 17 18 20 17   Temp:   97.8 F (36.6 C) 98.1 F (36.7 C)  TempSrc:   Oral Oral  SpO2: 100% 100% 100% 100%  Weight:      Height:        Wt Readings from Last 3 Encounters:  12/06/18 102.1 kg (99 %, Z= 2.27)*   * Growth percentiles are based on CDC (Girls, 2-20 Years) data.     Intake/Output Summary (Last 24 hours) at 12/07/2018 1337 Last data filed at 12/07/2018 0900 Gross  per 24 hour  Intake 918.81 ml  Output -  Net 918.81 ml    Physical Exam:   GENERAL: Pleasant-appearing in no apparent distress.  HEAD, EYES, EARS, NOSE AND THROAT: Atraumatic, normocephalic. Extraocular muscles are intact. Pupils equal and reactive to light. Sclerae anicteric. No conjunctival injection. No oro-pharyngeal erythema.  NECK: Supple. There is no jugular venous distention. No bruits, no lymphadenopathy, no thyromegaly.  HEART: Regular rate and rhythm,. No murmurs, no rubs, no clicks.  LUNGS: Clear to auscultation bilaterally. No rales or rhonchi. No wheezes.  ABDOMEN: Soft, flat, nontender, nondistended. Has  good bowel sounds. No hepatosplenomegaly appreciated.  EXTREMITIES: No evidence of any cyanosis, clubbing, or peripheral edema.  +2 pedal and radial pulses bilaterally.  NEUROLOGIC: The patient is alert, awake, and oriented x3 with no focal motor or sensory deficits appreciated bilaterally.  SKIN: Moist and warm with no rashes appreciated.  Psych: Not anxious, depressed LN: No inguinal LN enlargement    Antibiotics   Anti-infectives (From admission, onward)   None      Medications   Scheduled Meds: . enoxaparin (LOVENOX) injection  40 mg Subcutaneous Q12H  . levETIRAcetam  500 mg Oral BID  . venlafaxine XR  37.5 mg Oral Q breakfast   Continuous Infusions: PRN Meds:.acetaminophen **OR** acetaminophen, LORazepam, magnesium hydroxide, ondansetron **OR** ondansetron (ZOFRAN) IV, traZODone   Data Review:   Micro Results Recent Results (from the past 240 hour(s))  SARS Coronavirus 2 (CEPHEID - Performed in [facility] lab), Hosp Order     Status: None   Collection Time: 12/07/18 12:40 AM  Result Value Ref Range Status   SARS Coronavirus 2 NEGATIVE NEGATIVE Final    Comment: (NOTE) If result is NEGATIVE SARS-CoV-2 target nucleic acids are NOT DETECTED. The SARS-CoV-2 RNA is generally detectable in upper and lower  respiratory specimens during the  acute phase of infection. The lowest  concentration of SARS-CoV-2 viral copies this assay can detect is 250  copies / mL. A negative result does not preclude SARS-CoV-2 infection  and should not be used as the sole basis for treatment or other  patient management decisions.  A negative result may occur with  improper specimen collection / handling, submission of specimen other  than nasopharyngeal swab, presence of viral mutation(s) within the  areas targeted by this assay, and inadequate number of viral copies  (<250 copies / mL). A negative result must be combined with clinical  observations, patient history, and epidemiological information. If result is POSITIVE SARS-CoV-2 target nucleic acids are DETECTED. The SARS-CoV-2 RNA is generally detectable in upper and lower  respiratory specimens dur ing the acute phase of infection.  Positive  results are indicative of active infection with SARS-CoV-2.  Clinical  correlation with patient history and other diagnostic information is  necessary to determine patient infection status.  Positive results do  not rule out bacterial infection or co-infection with other viruses. If result is PRESUMPTIVE POSTIVE SARS-CoV-2 nucleic acids MAY BE PRESENT.   A presumptive positive result was obtained on the submitted specimen  and confirmed on repeat testing.  While 2019 novel coronavirus  (SARS-CoV-2) nucleic acids may be present in the submitted sample  additional confirmatory testing may be necessary for epidemiological  and / or clinical management purposes  to differentiate between  SARS-CoV-2 and other Sarbecovirus currently known to infect humans.  If clinically indicated additional testing with an alternate test  methodology [phone]([id number]) is advised. The SARS-CoV-2 RNA is generally  detectable in upper and lower respiratory sp ecimens during the acute  phase of infection. The expected result is Negative. Fact Sheet for Patients:   BoilerBrush.com.cyhttps://www.fda.gov/media/136312/download Fact Sheet for Healthcare Providers: https://pope.com/https://www.fda.gov/media/136313/download This test is not yet approved or cleared by the [place] FDA and has been authorized for detection and/or diagnosis of SARS-CoV-2 by FDA under an Emergency Use Authorization (EUA).  This EUA will remain in effect (meaning this test can be used) for the duration of the COVID-19 declaration under Section 564(b)(1) of the Act, 21 U.S.C. section 360bbb-3(b)(1), unless the authorization is terminated or revoked sooner. Performed at [facility], [address] [place], [place] [address]     Radiology Reports Ct Head Wo Contrast  Result Date: 12/06/2018 CLINICAL DATA:  18 y/o  F; Seizure, new, nontraumatic, 18-40 yrs. EXAM: CT HEAD WITHOUT CONTRAST TECHNIQUE: Contiguous axial images were obtained from the base of the skull through the vertex without intravenous contrast. COMPARISON:  None. FINDINGS: Brain: No evidence of acute infarction, hemorrhage, hydrocephalus, extra-axial collection or mass lesion/mass effect. Empty sella turcica. Vascular: No hyperdense vessel or unexpected calcification. Skull: Normal. Negative for fracture or focal lesion. Sinuses/Orbits: No acute finding. Other: None. IMPRESSION: No acute intracranial abnormality identified. Empty sella turcica. Otherwise unremarkable CT of the head. Electronically Signed   By: Mitzi Hansen M.D.   On: 12/06/2018 22:53   Mr Brain Wo Contrast  Result Date: 12/07/2018 CLINICAL DATA:  18 year old female with witnessed seizure activity, 1st seizure. EXAM: MRI HEAD WITHOUT CONTRAST TECHNIQUE: Multiplanar, multiecho pulse sequences of the brain and surrounding structures were obtained without intravenous contrast. COMPARISON:  Head CT 12/06/2018. FINDINGS: Brain: No restricted diffusion to suggest acute infarction. No midline shift, mass effect, evidence of mass lesion, ventriculomegaly, extra-axial  collection or acute intracranial hemorrhage. Cervicomedullary junction and pituitary are within normal limits. On thin slice coronal images the hippocampal formations appear symmetric and within normal limits (series 10, image 14). Wallace Cullens and white matter signal is within normal limits throughout the brain. No encephalomalacia or chronic cerebral blood products identified. Vascular: Major intracranial vascular flow voids are grossly preserved, the distal left vertebral artery appears dominant. Skull and upper cervical spine: Negative visible cervical spine. Visualized bone marrow signal is within normal limits. Sinuses/Orbits: Negative orbits. Paranasal sinuses and mastoids are stable and well pneumatized. Other: Visible internal auditory structures appear normal. Scalp and face soft tissues appear negative. IMPRESSION: Normal noncontrast MRI appearance of the brain. Electronically Signed   By: Odessa Fleming M.D.   On: 12/07/2018 02:05     CBC Recent Labs  Lab 12/06/18 2211 12/07/18 0443  WBC 10.1 9.1  HGB 12.8 12.1  HCT 38.6 37.3  PLT 200 194  MCV 94.6 95.4  MCH 31.4 30.9  MCHC 33.2 32.4  RDW 12.1 12.1  LYMPHSABS 2.2  --   MONOABS 0.6  --   EOSABS 0.1  --   BASOSABS 0.0  --     Chemistries  Recent Labs  Lab 12/06/18 2211 12/07/18 0443  NA 138 138  K 3.7 3.7  CL 105 108  CO2 26 22  GLUCOSE 107* 108*  BUN 16 16  CREATININE 0.62 0.54  CALCIUM 9.1 8.6*  MG  --  2.0  AST 16  --   ALT 13  --   ALKPHOS 75  --   BILITOT 0.4  --    ------------------------------------------------------------------------------------------------------------------ estimated creatinine clearance is 126.6 mL/min (by C-G formula based on SCr of 0.54 mg/dL). ------------------------------------------------------------------------------------------------------------------ No results for input(s): HGBA1C in the last 72  hours. ------------------------------------------------------------------------------------------------------------------ No results for input(s): CHOL, HDL, LDLCALC, TRIG, CHOLHDL, LDLDIRECT in the last 72 hours. ------------------------------------------------------------------------------------------------------------------ No results for input(s): TSH, T4TOTAL, T3FREE, THYROIDAB in the last 72 hours.  Invalid input(s): FREET3 ------------------------------------------------------------------------------------------------------------------ No results for input(s): VITAMINB12, FOLATE, FERRITIN, TIBC, IRON, RETICCTPCT in the last 72 hours.  Coagulation profile No results for input(s): INR, PROTIME in the last 168 hours.  No results for input(s): DDIMER in the last 72 hours.  Cardiac Enzymes No results for input(s): CKMB, TROPONINI, MYOGLOBIN in the last 168 hours.  Invalid input(s): CK ------------------------------------------------------------------------------------------------------------------ Invalid input(s):  POCBNP    Assessment & Plan   1.  Seizure disorder of new onset.    EEG noted Patient will be given IV Keppra Oral Keppra twice daily Multiple seizures we will monitor patient in the hospital tonight  2.  Depression.  Her Pristiq will be continued.  3.  Migraine.  Current active headache.  4.  Recent UTI.  This has resolved.  5.  DVT prophylaxis.  This will be provided with subcutaneous Lovenox.     Code Status Orders  (From admission, onward)         Start     Ordered   12/07/18 0047  Full code  Continuous     12/07/18 0050        Code Status History    This patient has a current code status but no historical code status.           Consultsneurology  DVT Prophylaxis  Lovenox    Lab Results  Component Value Date   PLT 194 12/07/2018     Time Spent in minutes 1120 to 1205 pm  Greater than 50% of time spent in care  coordination and counseling patient regarding the condition and plan of care.   Auburn Bilberry M.D on 12/07/2018 at 1:37 PM  Between 7am to 6pm - Pager - [phone]  After 6pm go to [email or web address] - Social research officer, government  [employer]   Office  [phone]

## 2018-12-07 NOTE — Procedures (Signed)
ELECTROENCEPHALOGRAM REPORT   Patient: Natalie Pham       Room #: 126A-AA EEG No. ID: [id number] Age: 18 y.o.        Sex: female Referring Physician: Allena Katz Report Date:  12/07/2018        Interpreting Physician: Thana Farr  History: Natalie Pham is an 18 y.o. female with seizure-like activity  Medications:  Effexor  Conditions of Recording:  This is a 21 channel routine scalp EEG performed with bipolar and monopolar montages arranged in accordance to the international 10/20 system of electrode placement. One channel was dedicated to EKG recording.  The patient is in the awake, drowsy and asleep states.  Description:  The waking background activity consists of a low voltage, symmetrical, fairly well organized, 10 Hz alpha activity, seen from the parieto-occipital and posterior temporal regions.  Low voltage fast activity, poorly organized, is seen anteriorly and is at times superimposed on more posterior regions.  A mixture of theta and alpha rhythms are seen from the central and temporal regions. The patient drowses with slowing to irregular, low voltage theta and beta activity.   The patient goes in to a light sleep with symmetrical sleep spindles, vertex central sharp transients and irregular slow activity.  No epileptiform activity is noted.   Hyperventilation was performed and produces a mild to moderate buildup but failed to elicit any abnormalities.  Intermittent photic stimulation was performed but failed to illicit any change in the tracing.    IMPRESSION: Normal electroencephalogram, awake, asleep and with activation procedures. There are no focal lateralizing or epileptiform features.   Thana Farr, MD Neurology [phone] 12/07/2018, 12:00 PM

## 2018-12-07 NOTE — Progress Notes (Signed)
MEDICATION RELATED CONSULT NOTE - INITIAL   Pharmacy Consult for drug-drug interactions Indication: initiation of anti-epileptic therapy  Allergies  Allergen Reactions  . Abilify [Aripiprazole] Rash    Patient Measurements: Height: 5\' 2"  (157.5 cm) Weight: 225 lb (102.1 kg) IBW/kg (Calculated) : 50.1 Adjusted Body Weight: 80 kg  Vital Signs: Temp: 98.1 F (36.7 C) (05/21 1008) Temp Source: Oral (05/21 1008) BP: 115/60 (05/21 1008) Pulse Rate: 77 (05/21 1008) Intake/Output from previous day: 05/20 0701 - 05/21 0700 In: 798.8 [I.V.:298.8; IV Piggyback:500] Out: -  Intake/Output from this shift: Total I/O In: 120 [P.O.:120] Out: -   Labs: Recent Labs    12/06/18 2211 12/07/18 0443  WBC 10.1 9.1  HGB 12.8 12.1  HCT 38.6 37.3  PLT 200 194  CREATININE 0.62 0.54  MG  --  2.0  PHOS  --  4.2  ALBUMIN 4.0  --   PROT 7.2  --   AST 16  --   ALT 13  --   ALKPHOS 75  --   BILITOT 0.4  --    Estimated Creatinine Clearance: 126.6 mL/min (by C-G formula based on SCr of 0.54 mg/dL).     Medical History: History reviewed. No pertinent past medical history.  Medications:  Scheduled:  . enoxaparin (LOVENOX) injection  40 mg Subcutaneous Q12H  . levETIRAcetam  500 mg Oral BID  . venlafaxine XR  37.5 mg Oral Q breakfast   PTA Medications:  Pristiq 25 mg QD (non-formulary medication at [facility]) Hydroxyzine 25 mg TID Triamcinolone cream 0.5% BID   Drug interactions:  Venlafaxine (inpatient medication) interacts with trazodone- may cause serotonin syndrome. However, the patient is getting trazodone as needed.   Assessment: Patient arrive s/t HA and tonic-clonic seizures which are apparently recurrent. At this time, there are no drug interactions with current antiepileptics. Patient has not had trazodone at this time.   Goal of Therapy:  Cessation of seizures  Plan:  No significant drug-drug interactions noted at this time. May consider switching trazodone to  benadryl for sleep.  Will continue to monitor.  Cephus Shelling, PharmD Clinical Pharmacist 12/07/2018

## 2018-12-07 NOTE — Plan of Care (Signed)
  Problem: Education: Goal: Expressions of having a comfortable level of knowledge regarding the disease process will increase Outcome: Progressing   Problem: Coping: Goal: Ability to adjust to condition or change in health will improve Outcome: Progressing Goal: Ability to identify appropriate support needs will improve Outcome: Progressing   Problem: Health Behavior/Discharge Planning: Goal: Compliance with prescribed medication regimen will improve Outcome: Progressing   Problem: Medication: Goal: Risk for medication side effects will decrease Outcome: Progressing   Problem: Clinical Measurements: Goal: Complications related to the disease process, condition or treatment will be avoided or minimized Outcome: Progressing Goal: Diagnostic test results will improve Outcome: Progressing   Problem: Safety: Goal: Verbalization of understanding the information provided will improve Outcome: Progressing   Problem: Self-Concept: Goal: Level of anxiety will decrease Outcome: Progressing Goal: Ability to verbalize feelings about condition will improve Outcome: Progressing   Problem: Education: Goal: Knowledge of General Education information will improve Description Including pain rating scale, medication(s)/side effects and non-pharmacologic comfort measures Outcome: Progressing   Problem: Health Behavior/Discharge Planning: Goal: Ability to manage health-related needs will improve Outcome: Progressing   Problem: Clinical Measurements: Goal: Ability to maintain clinical measurements within normal limits will improve Outcome: Progressing Goal: Will remain free from infection Outcome: Progressing Goal: Diagnostic test results will improve Outcome: Progressing Goal: Respiratory complications will improve Outcome: Progressing Goal: Cardiovascular complication will be avoided Outcome: Progressing   Problem: Activity: Goal: Risk for activity intolerance will  decrease Outcome: Progressing   Problem: Nutrition: Goal: Adequate nutrition will be maintained Outcome: Not Applicable   Problem: Coping: Goal: Level of anxiety will decrease Outcome: Progressing   Problem: Elimination: Goal: Will not experience complications related to bowel motility Outcome: Progressing Goal: Will not experience complications related to urinary retention Outcome: Progressing   Problem: Pain Managment: Goal: General experience of comfort will improve Outcome: Progressing   Problem: Safety: Goal: Ability to remain free from injury will improve Outcome: Progressing   Problem: Skin Integrity: Goal: Risk for impaired skin integrity will decrease Outcome: Not Applicable

## 2018-12-07 NOTE — H&P (Signed)
[facility] - [place] at [facility]    PATIENT NAME: Natalie Pham    MR#:  [id number]  DATE OF BIRTH:  Nov 22, 2000  DATE OF ADMISSION:  12/06/2018  PRIMARY CARE PHYSICIAN: System, Pcp Not In   REQUESTING/REFERRING PHYSICIAN: Chiquita Loth, MD  CHIEF COMPLAINT:   Chief Complaint  Patient presents with  . Headache  And recurrent seizures  HISTORY OF PRESENT ILLNESS:  Natalie Pham  is a 18 y.o. Caucasian female with a known history of migraine and recent UTI for which she was treated with Ceftin, presented to the emergency room with acute onset of recurrent episodes of tonic-clonic witnessed seizures.  According to her friend she had 10-15 episodes that lasted about 30 seconds.  Each time her eyes would roll back and she will be shaking all over.  She has had subsequent postictal phase that lasted about 10 to 15 minutes.  No paresthesias or focal muscle weakness.  No headache or dizziness or blurred vision.  No tinnitus or vertigo.  The patient had a similar episode of seizure about a week ago that lasted about 5 minutes.  She was then seen in an ER in [place] where she was diagnosed with UTI and was told she had migraine.  When she came to the ER, blood pressure was 155/78 with otherwise normal vital signs.  Labs were unremarkable.  Urine drug screen came back negative and urine pregnancy test was negative.  UA was clear.  Head CT scan revealed no acute intracranial abnormalities.  EKG showed normal sinus rhythm with a rate of 91.  The patient was given half a milligram of IV Ativan as well as 500 mL IV normal saline bolus.  She will be admitted to a medical monitored bed for further evaluation and management. PAST MEDICAL HISTORY:  1.  Migraine 2.  UTI 3.  Seizure  PAST SURGICAL HISTORY:   Past Surgical History:  Procedure Laterality Date  . APPENDECTOMY      SOCIAL HISTORY:   Social History   Tobacco Use  . Smoking status: Current Every Day Smoker  .  Smokeless tobacco: Never Used  Substance Use Topics  . Alcohol use: Never    Frequency: Never    FAMILY HISTORY:  No family history on file.  DRUG ALLERGIES:   Allergies  Allergen Reactions  . Abilify [Aripiprazole] Rash    REVIEW OF SYSTEMS:   ROS  MEDICATIONS AT HOME:   Prior to Admission medications   Medication Sig Start Date End Date Taking? Authorizing Provider  Desvenlafaxine Succinate ER (PRISTIQ) 25 MG TB24 Take 1 tablet by mouth daily.   Yes [provider]  hydrOXYzine (ATARAX/VISTARIL) 25 MG tablet Take 25 mg by mouth 3 (three) times daily as needed.   Yes [provider]  triamcinolone cream (KENALOG) 0.5 % Apply 1 application topically 2 (two) times daily. Use 1 application twice daily for 14 days on affected area. 11/30/18 12/14/18 Yes [provider]      VITAL SIGNS:  Blood pressure 123/75, pulse 75, temperature 97.8 F (36.6 C), temperature source Oral, resp. rate 20, height  (1.575 m), weight 102.1 kg, last menstrual period 11/06/2018, SpO2 100 %.  PHYSICAL EXAMINATION:  Physical Exam  GENERAL:  18 y.o.-year-old Caucasian patient lying in the bed with no acute distress.  EYES: Pupils equal, round, reactive to light and accommodation. No scleral icterus. Extraocular muscles intact.  HEENT: Head atraumatic, normocephalic. Oropharynx and nasopharynx clear. No oropharyngeal erythema, moist oral mucosa  NECK:  Supple, no jugular venous distention. No thyroid enlargement, no tenderness.  LUNGS: Normal breath sounds bilaterally, no wheezing, rales, rhonchi. No use of accessory muscles of respiration.  CARDIOVASCULAR: S1, S2 RRR. No murmurs, rubs, gallops, clicks.  ABDOMEN: Soft, nontender, nondistended. Bowel sounds present. No organomegaly or mass.  EXTREMITIES: No pedal edema, cyanosis, or clubbing. + 2 pedal & radial pulses b/l.   NEUROLOGIC: Cranial nerves II through XII are intact. No focal Motor or sensory deficits  appreciated b/l PSYCHIATRIC: The patient is alert and oriented x 3. Good affect.  SKIN: No obvious rash, lesion, or ulcer.   LABORATORY PANEL:   CBC Recent Labs  Lab 12/06/18 2211  WBC 10.1  HGB 12.8  HCT 38.6  PLT 200   ------------------------------------------------------------------------------------------------------------------  Chemistries  Recent Labs  Lab 12/06/18 2211  NA 138  K 3.7  CL 105  CO2 26  GLUCOSE 107*  BUN 16  CREATININE 0.62  CALCIUM 9.1  AST 16  ALT 13  ALKPHOS 75  BILITOT 0.4   ------------------------------------------------------------------------------------------------------------------  Cardiac Enzymes No results for input(s): TROPONINI in the last 168 hours. ------------------------------------------------------------------------------------------------------------------  RADIOLOGY:  Ct Head Wo Contrast  Result Date: 12/06/2018 CLINICAL DATA:  18 y/o  F; Seizure, new, nontraumatic, 18-40 yrs. EXAM: CT HEAD WITHOUT CONTRAST TECHNIQUE: Contiguous axial images were obtained from the base of the skull through the vertex without intravenous contrast. COMPARISON:  None. FINDINGS: Brain: No evidence of acute infarction, hemorrhage, hydrocephalus, extra-axial collection or mass lesion/mass effect. Empty sella turcica. Vascular: No hyperdense vessel or unexpected calcification. Skull: Normal. Negative for fracture or focal lesion. Sinuses/Orbits: No acute finding. Other: None. IMPRESSION: No acute intracranial abnormality identified. Empty sella turcica. Otherwise unremarkable CT of the head. Electronically Signed   By: Mitzi HansenLance  Furusawa-Stratton M.D.   On: 12/06/2018 22:53   Mr Brain Wo Contrast  Result Date: 12/07/2018 CLINICAL DATA:  18 year old female with witnessed seizure activity, 1st seizure. EXAM: MRI HEAD WITHOUT CONTRAST TECHNIQUE: Multiplanar, multiecho pulse sequences of the brain and surrounding structures were obtained without  intravenous contrast. COMPARISON:  Head CT 12/06/2018. FINDINGS: Brain: No restricted diffusion to suggest acute infarction. No midline shift, mass effect, evidence of mass lesion, ventriculomegaly, extra-axial collection or acute intracranial hemorrhage. Cervicomedullary junction and pituitary are within normal limits. On thin slice coronal images the hippocampal formations appear symmetric and within normal limits (series 10, image 14). Wallace CullensGray and white matter signal is within normal limits throughout the brain. No encephalomalacia or chronic cerebral blood products identified. Vascular: Major intracranial vascular flow voids are grossly preserved, the distal left vertebral artery appears dominant. Skull and upper cervical spine: Negative visible cervical spine. Visualized bone marrow signal is within normal limits. Sinuses/Orbits: Negative orbits. Paranasal sinuses and mastoids are stable and well pneumatized. Other: Visible internal auditory structures appear normal. Scalp and face soft tissues appear negative. IMPRESSION: Normal noncontrast MRI appearance of the brain. Electronically Signed   By: Odessa FlemingH  Hall M.D.   On: 12/07/2018 02:05     IMPRESSION AND PLAN:   1.  Seizure disorder of new onset.  Patient will be admitted to a medically monitored bed.  She will be placed on seizures precautions.  We will place her on PRN IV Ativan.  We will obtain an awake and asleep EEG.  Neurology consultation will be obtained by Dr. Thad Rangereynolds who was notified about the patient.  2.  Depression.  Her Pristiq will be continued.  3.  Migraine.  Current active headache.  4.  Recent  UTI.  This has resolved.  5.  DVT prophylaxis.  This will be provided with subcutaneous Lovenox.    All the records are reviewed and case discussed with ED provider. Management plans discussed with the patient, family and they are in agreement.  CODE STATUS: Full  TOTAL TIME TAKING CARE OF THIS PATIENT: 50 minutes.    Hannah Beat  M.D on 12/07/2018 at 3:24 AM  Between 7am to 6pm - Pager - [phone]  After 6pm go to [email or web address] - password [email or web address]  [facility]  Office  [phone]  CC: Primary care physician; System, Pcp Not In

## 2018-12-07 NOTE — ED Notes (Signed)
Pt to MRI via stretcher accomp by MRI tech 

## 2018-12-08 LAB — HIV ANTIBODY (ROUTINE TESTING W REFLEX): HIV Screen 4th Generation wRfx: NONREACTIVE

## 2018-12-08 MED ORDER — LEVETIRACETAM 500 MG PO TABS: 500.0000 mg | ORAL_TABLET | Freq: Two times a day (BID) | ORAL | 2 refills | Status: DC

## 2018-12-08 NOTE — Progress Notes (Signed)
[facility] - [place] at [facility] was admitted to the Hospital on 12/06/2018 and Discharged  12/08/2018 and should be excused from work/school   for  5 days starting 12/06/2018 , may return to work/school without any physical  Restrictions except driving .  Call Auburn Bilberry MD with questions.  Auburn Bilberry M.D on 12/08/2018,at 8:34 AM  [facility] -  at [facility]  [phone]

## 2018-12-08 NOTE — Discharge Summary (Signed)
[facility] - [place] at [facility], [place] y.o., DOB [birth year], MRN [id number]. Admission date: 12/06/2018 Discharge Date 12/08/2018 Primary MD System, Pcp Not In Admitting Physician Hannah Beat, MD  Admission Diagnosis  Empty sella (HCC) [E23.6] Seizure [facility]) [R56.9]  Discharge Diagnosis   Active Problems: Seizure [facility]) Depression Migraine Recent UTI       Hospital Course  Patient is 18 year old with no history of seizure was admitted with multiple episodes of seizure.  She was seen in the emergency room and had a CT scan of the head which was negative.  Patient was admitted for further evaluation.  She was seen in consultation by neurology and underwent a EEG which did not show any epileptic activity.  Neurology recommended Keppra.  Patient has not had any further seizures.  Patient is recommended not to drive for 6 months. She is also given instructions on other seizure precautions.            Consults  neurology  Significant Tests:  See full reports for all details    Ct Head Wo Contrast  Result Date: 12/06/2018 CLINICAL DATA:  18 y/o  F; Seizure, new, nontraumatic, 18-40 yrs. EXAM: CT HEAD WITHOUT CONTRAST TECHNIQUE: Contiguous axial images were obtained from the base of the skull through the vertex without intravenous contrast. COMPARISON:  None. FINDINGS: Brain: No evidence of acute infarction, hemorrhage, hydrocephalus, extra-axial collection or mass lesion/mass effect. Empty sella turcica. Vascular: No hyperdense vessel or unexpected calcification. Skull: Normal. Negative for fracture or focal lesion. Sinuses/Orbits: No acute finding. Other: None. IMPRESSION: No acute intracranial abnormality identified. Empty sella turcica. Otherwise unremarkable CT of the head. Electronically Signed   By: Mitzi Hansen M.D.   On: 12/06/2018 22:53   Mr Brain Wo Contrast  Result Date: 12/07/2018 CLINICAL DATA:  18 year old female with  witnessed seizure activity, 1st seizure. EXAM: MRI HEAD WITHOUT CONTRAST TECHNIQUE: Multiplanar, multiecho pulse sequences of the brain and surrounding structures were obtained without intravenous contrast. COMPARISON:  Head CT 12/06/2018. FINDINGS: Brain: No restricted diffusion to suggest acute infarction. No midline shift, mass effect, evidence of mass lesion, ventriculomegaly, extra-axial collection or acute intracranial hemorrhage. Cervicomedullary junction and pituitary are within normal limits. On thin slice coronal images the hippocampal formations appear symmetric and within normal limits (series 10, image 14). Wallace Cullens and white matter signal is within normal limits throughout the brain. No encephalomalacia or chronic cerebral blood products identified. Vascular: Major intracranial vascular flow voids are grossly preserved, the distal left vertebral artery appears dominant. Skull and upper cervical spine: Negative visible cervical spine. Visualized bone marrow signal is within normal limits. Sinuses/Orbits: Negative orbits. Paranasal sinuses and mastoids are stable and well pneumatized. Other: Visible internal auditory structures appear normal. Scalp and face soft tissues appear negative. IMPRESSION: Normal noncontrast MRI appearance of the brain. Electronically Signed   By: Odessa Fleming M.D.   On: 12/07/2018 02:05       Today   Subjective:   Natalie Pham patient doing well denies any complaints  Objective:   Blood pressure 121/69, pulse 76, temperature 97.8 F (36.6 C), temperature source Oral, resp. rate 16, height 5\' 2"  (1.575 m), weight 102.1 kg, last menstrual period 11/06/2018, SpO2 98 %.  .  Intake/Output Summary (Last 24 hours) at 12/08/2018 1341 Last data filed at 12/08/2018 0956 Gross per 24 hour  Intake 1185.77 ml  Output -  Net 1185.77 ml    Exam VITAL SIGNS: Blood pressure 121/69, pulse 76, temperature 97.8  F (36.6 C), temperature source Oral, resp. rate 16, height   (1.575 m), weight 102.1 kg, last menstrual period 11/06/2018, SpO2 98 %.  GENERAL:  18 y.o.-year-old patient lying in the bed with no acute distress.  EYES: Pupils equal, round, reactive to light and accommodation. No scleral icterus. Extraocular muscles intact.  HEENT: Head atraumatic, normocephalic. Oropharynx and nasopharynx clear.  NECK:  Supple, no jugular venous distention. No thyroid enlargement, no tenderness.  LUNGS: Normal breath sounds bilaterally, no wheezing, rales,rhonchi or crepitation. No use of accessory muscles of respiration.  CARDIOVASCULAR: S1, S2 normal. No murmurs, rubs, or gallops.  ABDOMEN: Soft, nontender, nondistended. Bowel sounds present. No organomegaly or mass.  EXTREMITIES: No pedal edema, cyanosis, or clubbing.  NEUROLOGIC: Cranial nerves II through XII are intact. Muscle strength 5/5 in all extremities. Sensation intact. Gait not checked.  PSYCHIATRIC: The patient is alert and oriented x 3.  SKIN: No obvious rash, lesion, or ulcer.   Data Review     CBC w Diff:  Lab Results  Component Value Date   WBC 9.1 12/07/2018   HGB 12.1 12/07/2018   HCT 37.3 12/07/2018   PLT 194 12/07/2018   LYMPHOPCT 21 12/06/2018   MONOPCT 6 12/06/2018   EOSPCT 1 12/06/2018   BASOPCT 0 12/06/2018   CMP:  Lab Results  Component Value Date   NA 138 12/07/2018   K 3.7 12/07/2018   CL 108 12/07/2018   CO2 22 12/07/2018   BUN 16 12/07/2018   CREATININE 0.54 12/07/2018   PROT 7.2 12/06/2018   ALBUMIN 4.0 12/06/2018   BILITOT 0.4 12/06/2018   ALKPHOS 75 12/06/2018   AST 16 12/06/2018   ALT 13 12/06/2018  .  Micro Results Recent Results (from the past 240 hour(s))  SARS Coronavirus 2 (CEPHEID - Performed in [facility] hospital lab), Hosp Order     Status: None   Collection Time: 12/07/18 12:40 AM  Result Value Ref Range Status   SARS Coronavirus 2 NEGATIVE NEGATIVE Final    Comment: (NOTE) If result is NEGATIVE SARS-CoV-2 target nucleic acids are NOT  DETECTED. The SARS-CoV-2 RNA is generally detectable in upper and lower  respiratory specimens during the acute phase of infection. The lowest  concentration of SARS-CoV-2 viral copies this assay can detect is 250  copies / mL. A negative result does not preclude SARS-CoV-2 infection  and should not be used as the sole basis for treatment or other  patient management decisions.  A negative result may occur with  improper specimen collection / handling, submission of specimen other  than nasopharyngeal swab, presence of viral mutation(s) within the  areas targeted by this assay, and inadequate number of viral copies  (<250 copies / mL). A negative result must be combined with clinical  observations, patient history, and epidemiological information. If result is POSITIVE SARS-CoV-2 target nucleic acids are DETECTED. The SARS-CoV-2 RNA is generally detectable in upper and lower  respiratory specimens dur ing the acute phase of infection.  Positive  results are indicative of active infection with SARS-CoV-2.  Clinical  correlation with patient history and other diagnostic information is  necessary to determine patient infection status.  Positive results do  not rule out bacterial infection or co-infection with other viruses. If result is PRESUMPTIVE POSTIVE SARS-CoV-2 nucleic acids MAY BE PRESENT.   A presumptive positive result was obtained on the submitted specimen  and confirmed on repeat testing.  While 2019 novel coronavirus  (SARS-CoV-2) nucleic acids may be present in  the submitted sample  additional confirmatory testing may be necessary for epidemiological  and / or clinical management purposes  to differentiate between  SARS-CoV-2 and other Sarbecovirus currently known to infect humans.  If clinically indicated additional testing with an alternate test  methodology [phone]([id number]) is advised. The SARS-CoV-2 RNA is generally  detectable in upper and lower respiratory sp ecimens during  the acute  phase of infection. The expected result is Negative. Fact Sheet for Patients:  BoilerBrush.com.cyhttps://www.fda.gov/media/136312/download Fact Sheet for Healthcare Providers: https://pope.com/https://www.fda.gov/media/136313/download This test is not yet approved or cleared by the [place] FDA and has been authorized for detection and/or diagnosis of SARS-CoV-2 by FDA under an Emergency Use Authorization (EUA).  This EUA will remain in effect (meaning this test can be used) for the duration of the COVID-19 declaration under Section 564(b)(1) of the Act, 21 U.S.C. section 360bbb-3(b)(1), unless the authorization is terminated or revoked sooner. Performed at [facility], [address] [place], [place] [address]      Code Status History    Date Active Date Inactive Code Status Order ID Comments User Context   12/07/2018 0051 12/08/2018 1316 Full Code [id number]  Mansy, Vernetta HoneyJan A, MD ED          Follow-up Information    Morene CrockerPotter, Zachary E, MD In 2 weeks.   Specialty:  Neurology Why:  new onset sz.  CALL OFFICE FOR A APPOINTMENT WITH DR. POTTER Contact information: [address] [facility] [place] [address] [phone]           Discharge Medications   Allergies as of 12/08/2018      Reactions   Abilify [aripiprazole] Rash      Medication List    TAKE these medications   hydrOXYzine 25 MG tablet Commonly known as:  ATARAX/VISTARIL Take 25 mg by mouth 3 (three) times daily as needed.   levETIRAcetam 500 MG tablet Commonly known as:  KEPPRA Take 1 tablet (500 mg total) by mouth 2 (two) times daily.   Pristiq 25 MG Tb24 Generic drug:  Desvenlafaxine Succinate ER Take 1 tablet by mouth daily.   triamcinolone cream 0.5 % Commonly known as:  KENALOG Apply 1 application topically 2 (two) times daily. Use 1 application twice daily for 14 days on affected area.          Total Time in preparing paper work, data evaluation and todays exam  - 35 minutes  Auburn BilberryShreyang Srihan Brutus M.D on 12/08/2018 at 1:41 PM [facility]   Office  [phone]

## 2018-12-28 ENCOUNTER — Encounter: Payer: Self-pay | Admitting: Emergency Medicine

## 2018-12-28 DIAGNOSIS — Z79899 Other long term (current) drug therapy: Secondary | ICD-10-CM | POA: Diagnosis not present

## 2018-12-28 DIAGNOSIS — N390 Urinary tract infection, site not specified: Secondary | ICD-10-CM | POA: Diagnosis not present

## 2018-12-28 DIAGNOSIS — R569 Unspecified convulsions: Secondary | ICD-10-CM | POA: Insufficient documentation

## 2018-12-28 DIAGNOSIS — F17200 Nicotine dependence, unspecified, uncomplicated: Secondary | ICD-10-CM | POA: Diagnosis not present

## 2018-12-28 LAB — CBC
HCT: 38 % (ref 36.0–46.0)
Hemoglobin: 12.9 g/dL (ref 12.0–15.0)
MCH: 31.5 pg (ref 26.0–34.0)
MCHC: 33.9 g/dL (ref 30.0–36.0)
MCV: 92.7 fL (ref 80.0–100.0)
Platelets: 241 10*3/uL (ref 150–400)
RBC: 4.1 MIL/uL (ref 3.87–5.11)
RDW: 11.9 % (ref 11.5–15.5)
WBC: 10.4 10*3/uL (ref 4.0–10.5)
nRBC: 0 % (ref 0.0–0.2)

## 2018-12-28 MED ORDER — LEVETIRACETAM 500 MG PO TABS
500.0000 mg | ORAL_TABLET | Freq: Once | ORAL | Status: AC
  Administered 2018-12-29: 500 mg via ORAL
  Filled 2018-12-28: qty 1
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
## 2018-12-29 ENCOUNTER — Emergency Department
Admission: EM | Admit: 2018-12-29 | Discharge: 2018-12-29 | Disposition: A | Discharge status: Home / Self Care | Payer: Medicaid - Out of State | Attending: Emergency Medicine | Admitting: Emergency Medicine

## 2018-12-29 DIAGNOSIS — N390 Urinary tract infection, site not specified: Secondary | ICD-10-CM

## 2018-12-29 DIAGNOSIS — R569 Unspecified convulsions: Secondary | ICD-10-CM

## 2018-12-29 LAB — URINE DRUG SCREEN, QUALITATIVE (ARMC ONLY)
Amphetamines, Ur Screen: NOT DETECTED
Barbiturates, Ur Screen: NOT DETECTED
Benzodiazepine, Ur Scrn: NOT DETECTED
Cannabinoid 50 Ng, Ur ~~LOC~~: NOT DETECTED
Cocaine Metabolite,Ur ~~LOC~~: NOT DETECTED
MDMA (Ecstasy)Ur Screen: NOT DETECTED
Methadone Scn, Ur: NOT DETECTED
Opiate, Ur Screen: NOT DETECTED
Phencyclidine (PCP) Ur S: NOT DETECTED
Tricyclic, Ur Screen: NOT DETECTED

## 2018-12-29 LAB — BASIC METABOLIC PANEL
Anion gap: 7 (ref 5–15)
BUN: 17 mg/dL (ref 6–20)
CO2: 24 mmol/L (ref 22–32)
Calcium: 8.9 mg/dL (ref 8.9–10.3)
Chloride: 106 mmol/L (ref 98–111)
Creatinine, Ser: 0.88 mg/dL (ref 0.44–1.00)
GFR calc Af Amer: >60 mL/min (ref >60)
GFR calc non Af Amer: >60 mL/min (ref >60)
Glucose, Bld: 69 mg/dL — ABNORMAL LOW (ref 70–99)
Potassium: 3.3 mmol/L — ABNORMAL LOW (ref 3.5–5.1)
Sodium: 137 mmol/L (ref 135–145)

## 2018-12-29 LAB — URINALYSIS, COMPLETE (UACMP) WITH MICROSCOPIC
Bacteria, UA: NONE SEEN
Bilirubin Urine: NEGATIVE
Glucose, UA: NEGATIVE mg/dL
Hgb urine dipstick: NEGATIVE
Ketones, ur: NEGATIVE mg/dL
Nitrite: NEGATIVE
Protein, ur: NEGATIVE mg/dL
Specific Gravity, Urine: 1.017 (ref 1.005–1.030)
pH: 6 (ref 5.0–8.0)

## 2018-12-29 LAB — ETHANOL: Alcohol, Ethyl (B): <10 mg/dL (ref <10)

## 2018-12-29 LAB — ACETAMINOPHEN LEVEL: Acetaminophen (Tylenol), Serum: <10 ug/mL — ABNORMAL LOW (ref 10–30)

## 2018-12-29 LAB — SALICYLATE LEVEL: Salicylate Lvl: <7 mg/dL (ref 2.8–30.0)

## 2018-12-29 LAB — POCT PREGNANCY, URINE: Preg Test, Ur: NEGATIVE

## 2018-12-29 MED ORDER — FOSFOMYCIN TROMETHAMINE 3 G PO PACK
3.0000 g | PACK | Freq: Once | ORAL | Status: AC
  Administered 2018-12-29: 3 g via ORAL
  Filled 2018-12-29: qty 3
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
## 2018-12-29 NOTE — Discharge Instructions (Signed)
Do not drive or operate heavy machinery until you see your neurologist.  You have been treated with an antibiotic in the emergency department.  Return to the ER for worsening symptoms, persistent vomiting, difficulty breathing or other concerns.

## 2018-12-30 ENCOUNTER — Other Ambulatory Visit: Payer: Self-pay

## 2018-12-30 ENCOUNTER — Emergency Department
Admission: EM | Admit: 2018-12-30 | Discharge: 2019-01-02 | Disposition: A | Discharge status: Other Acute Inpatient Hospital | Payer: Medicaid - Out of State | Attending: Emergency Medicine | Admitting: Emergency Medicine

## 2018-12-30 DIAGNOSIS — F918 Other conduct disorders: Secondary | ICD-10-CM | POA: Diagnosis present

## 2018-12-30 DIAGNOSIS — F17200 Nicotine dependence, unspecified, uncomplicated: Secondary | ICD-10-CM | POA: Insufficient documentation

## 2018-12-30 DIAGNOSIS — R45851 Suicidal ideations: Secondary | ICD-10-CM | POA: Diagnosis not present

## 2018-12-30 DIAGNOSIS — T450X2S Poisoning by antiallergic and antiemetic drugs, intentional self-harm, sequela: Secondary | ICD-10-CM

## 2018-12-30 DIAGNOSIS — F319 Bipolar disorder, unspecified: Secondary | ICD-10-CM | POA: Diagnosis present

## 2018-12-30 DIAGNOSIS — R569 Unspecified convulsions: Secondary | ICD-10-CM | POA: Insufficient documentation

## 2018-12-30 DIAGNOSIS — R443 Hallucinations, unspecified: Secondary | ICD-10-CM | POA: Diagnosis not present

## 2018-12-30 DIAGNOSIS — R4689 Other symptoms and signs involving appearance and behavior: Secondary | ICD-10-CM

## 2018-12-30 DIAGNOSIS — F29 Unspecified psychosis not due to a substance or known physiological condition: Secondary | ICD-10-CM | POA: Insufficient documentation

## 2018-12-30 DIAGNOSIS — Z03818 Encounter for observation for suspected exposure to other biological agents ruled out: Secondary | ICD-10-CM | POA: Diagnosis not present

## 2018-12-30 DIAGNOSIS — Z79899 Other long term (current) drug therapy: Secondary | ICD-10-CM | POA: Diagnosis not present

## 2018-12-30 DIAGNOSIS — F3160 Bipolar disorder, current episode mixed, unspecified: Secondary | ICD-10-CM | POA: Diagnosis not present

## 2018-12-30 LAB — COMPREHENSIVE METABOLIC PANEL
ALT: 14 U/L (ref 0–44)
AST: 19 U/L (ref 15–41)
Albumin: 4.5 g/dL (ref 3.5–5.0)
Alkaline Phosphatase: 86 U/L (ref 38–126)
Anion gap: 10 (ref 5–15)
BUN: 15 mg/dL (ref 6–20)
CO2: 24 mmol/L (ref 22–32)
Calcium: 9.4 mg/dL (ref 8.9–10.3)
Chloride: 104 mmol/L (ref 98–111)
Creatinine, Ser: 0.7 mg/dL (ref 0.44–1.00)
GFR calc Af Amer: >60 mL/min (ref >60)
GFR calc non Af Amer: >60 mL/min (ref >60)
Glucose, Bld: 92 mg/dL (ref 70–99)
Potassium: 3.9 mmol/L (ref 3.5–5.1)
Sodium: 138 mmol/L (ref 135–145)
Total Bilirubin: 0.3 mg/dL (ref 0.3–1.2)
Total Protein: 8.3 g/dL — ABNORMAL HIGH (ref 6.5–8.1)

## 2018-12-30 LAB — CBC
HCT: 41.8 % (ref 36.0–46.0)
Hemoglobin: 13.7 g/dL (ref 12.0–15.0)
MCH: 30.7 pg (ref 26.0–34.0)
MCHC: 32.8 g/dL (ref 30.0–36.0)
MCV: 93.7 fL (ref 80.0–100.0)
Platelets: 267 10*3/uL (ref 150–400)
RBC: 4.46 MIL/uL (ref 3.87–5.11)
RDW: 11.9 % (ref 11.5–15.5)
WBC: 8.9 10*3/uL (ref 4.0–10.5)
nRBC: 0 % (ref 0.0–0.2)

## 2018-12-30 LAB — URINE DRUG SCREEN, QUALITATIVE (ARMC ONLY)
Amphetamines, Ur Screen: NOT DETECTED
Barbiturates, Ur Screen: NOT DETECTED
Benzodiazepine, Ur Scrn: NOT DETECTED
Cannabinoid 50 Ng, Ur ~~LOC~~: NOT DETECTED
Cocaine Metabolite,Ur ~~LOC~~: NOT DETECTED
MDMA (Ecstasy)Ur Screen: NOT DETECTED
Methadone Scn, Ur: NOT DETECTED
Opiate, Ur Screen: NOT DETECTED
Phencyclidine (PCP) Ur S: NOT DETECTED
Tricyclic, Ur Screen: NOT DETECTED

## 2018-12-30 LAB — ACETAMINOPHEN LEVEL: Acetaminophen (Tylenol), Serum: <10 ug/mL — ABNORMAL LOW (ref 10–30)

## 2018-12-30 LAB — SALICYLATE LEVEL: Salicylate Lvl: <7 mg/dL (ref 2.8–30.0)

## 2018-12-30 LAB — POCT PREGNANCY, URINE: Preg Test, Ur: NEGATIVE

## 2018-12-30 LAB — ETHANOL: Alcohol, Ethyl (B): <10 mg/dL (ref <10)
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
## 2018-12-30 NOTE — ED Notes (Signed)
Belongings:  1 pair glasses 1 pair gauge gray silicone earrings 1 silver chain 1 silver wedding band 1 pair camo crocs 1 blue long sleeve shirt 1 pair black nike pants  1 gray duffle bag
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
## 2018-12-30 NOTE — ED Notes (Signed)
Patients mother(Kimberly) gave password.  Mother states patients current behavior has been going on for the past month.  Mother states pt has been aggressive and acting out of character.  Mother states patient does not have hx of such behavior.  Mother states she can be contacted for additional information any time by calling [phone].

## 2018-12-30 NOTE — ED Notes (Signed)
Pt. Introduced to unit and advised of cameras and 15 minute checks.  Pt. Calm and cooperative.  Pt. Requested and was given meal tray drink.

## 2018-12-30 NOTE — ED Notes (Signed)
Pt walked to [employer] by this Rn and Product manager.

## 2018-12-30 NOTE — ED Notes (Addendum)
Pt dressed out by this tech and Amy RN. Own glasses being worn.  Pt belongings consist: Black socks, black sports bra, blue long sleeve shirt, black sweat pants and grey underwear.  Cup of jewelry: wedding ring, pair of gauges and necklace.  [employer] iphone inside bag

## 2018-12-31 LAB — SARS CORONAVIRUS 2 BY RT PCR (HOSPITAL ORDER, PERFORMED IN ~~LOC~~ HOSPITAL LAB): SARS Coronavirus 2: NEGATIVE

## 2018-12-31 MED ORDER — VENLAFAXINE HCL 37.5 MG PO TABS
75.0000 mg | ORAL_TABLET | Freq: Every day | ORAL | Status: DC
  Administered 2018-12-31: 75 mg via ORAL
  Filled 2018-12-31 (×3): qty 2

## 2018-12-31 MED ORDER — LEVETIRACETAM 500 MG PO TABS
500.0000 mg | ORAL_TABLET | Freq: Two times a day (BID) | ORAL | Status: DC
  Administered 2018-12-31 – 2019-01-02 (×5): 500 mg via ORAL
  Filled 2018-12-31 (×7): qty 1

## 2018-12-31 MED ORDER — OLANZAPINE 5 MG PO TABS
5.0000 mg | ORAL_TABLET | Freq: Two times a day (BID) | ORAL | Status: DC
  Administered 2018-12-31: 09:00:00 5 mg via ORAL
  Filled 2018-12-31: qty 1

## 2018-12-31 MED ORDER — LORAZEPAM 1 MG PO TABS: 1.0000 mg | ORAL_TABLET | ORAL | Status: DC | PRN

## 2018-12-31 NOTE — BH Assessment (Signed)
TTS available for teleassessment at [phone].
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
## 2018-12-31 NOTE — BH Assessment (Signed)
[place] referral information was faxed to the following:  [facility]  [facility]  [facility]  [facility]  [facility] [place]  [facility]

## 2018-12-31 NOTE — ED Notes (Signed)
Pt's wife called for an update on patient.  Security code provided.

## 2018-12-31 NOTE — ED Notes (Signed)
Report to include Situation, Background, Assessment, and Recommendations received from Rhea RN. Patient alert and oriented, warm and dry, in no acute distress. Patient denies SI, HI, AVH and pain. Patient made aware of Q15 minute rounds and security cameras for their safety. Patient instructed to come to me with needs or concerns. 

## 2018-12-31 NOTE — ED Notes (Signed)
Hourly rounding reveals patient in room. No complaints, stable, in no acute distress. Q15 minute rounds and monitoring via Security Cameras to continue. 

## 2018-12-31 NOTE — ED Notes (Signed)
Hourly rounding reveals patient sleeping in room. No complaints, stable, in no acute distress. Q15 minute rounds and monitoring via Security Cameras to continue. 

## 2018-12-31 NOTE — ED Provider Notes (Signed)
-----------------------------------------   4:18 AM on 12/31/2018 -----------------------------------------  Patient was evaluated by [facility] psychiatrist Dr. Bobbe Medico who recommends admission to inpatient psychiatry service.  Medication recommendations for olanzapine, Effexor and Ativan entered in the computer.   Paulette Blanch, MD 12/31/18 [phone]

## 2018-12-31 NOTE — ED Notes (Signed)
Pt given sandwich tray and shasta lime with ice. No other needs voiced at this time. Will continue to monitor Q15 minute rounds.

## 2018-12-31 NOTE — ED Notes (Signed)
Hourly rounding reveals patient sleeping in room. No complaints, stable, in no acute distress. Q15 minute rounds and monitoring via Rover and Officer to continue.  

## 2018-12-31 NOTE — BH Assessment (Signed)
TTS available for teleassessment at [phone].  Nurse reported patient was sleeping.  

## 2018-12-31 NOTE — ED Notes (Signed)
Pt's wife, Miachel Roux, called.  Pt gave the okay for this nurse to speak with wife.  Pt's wife requesting that psychiatrist or NP give her a call.

## 2018-12-31 NOTE — ED Notes (Signed)
SOC called report given, SOC machine set up and ready Pt. Ready.

## 2018-12-31 NOTE — BH Assessment (Signed)
Patient was denied admission to [facility].  They are unable to accept out of network - [place].

## 2019-01-01 DIAGNOSIS — R443 Hallucinations, unspecified: Secondary | ICD-10-CM | POA: Diagnosis present

## 2019-01-01 DIAGNOSIS — F3160 Bipolar disorder, current episode mixed, unspecified: Secondary | ICD-10-CM

## 2019-01-01 DIAGNOSIS — F319 Bipolar disorder, unspecified: Secondary | ICD-10-CM | POA: Diagnosis present

## 2019-01-01 DIAGNOSIS — T450X2S Poisoning by antiallergic and antiemetic drugs, intentional self-harm, sequela: Secondary | ICD-10-CM

## 2019-01-01 MED ORDER — VENLAFAXINE HCL ER 75 MG PO CP24
150.0000 mg | ORAL_CAPSULE | Freq: Every day | ORAL | Status: DC
  Administered 2019-01-02: 150 mg via ORAL
  Filled 2019-01-01: qty 2

## 2019-01-01 MED ORDER — DIPHENHYDRAMINE HCL 25 MG PO CAPS
50.0000 mg | ORAL_CAPSULE | Freq: Every evening | ORAL | Status: DC | PRN
  Administered 2019-01-01: 50 mg via ORAL
  Filled 2019-01-01: qty 2

## 2019-01-01 MED ORDER — VENLAFAXINE HCL 37.5 MG PO TABS
75.0000 mg | ORAL_TABLET | Freq: Once | ORAL | Status: AC
  Administered 2019-01-01: 20:00:00 75 mg via ORAL
  Filled 2019-01-01: qty 2

## 2019-01-01 NOTE — ED Notes (Signed)
Patient talking with her mother

## 2019-01-01 NOTE — ED Notes (Signed)
Hourly rounding reveals patient sleeping in room. No complaints, stable, in no acute distress. Q15 minute rounds and monitoring via Security Cameras to continue. 

## 2019-01-01 NOTE — ED Provider Notes (Signed)
-----------------------------------------   6:16 AM on 01/01/2019 -----------------------------------------   Blood pressure (!) 103/54, pulse 84, temperature 98.4 F (36.9 C), temperature source Oral, resp. rate 18, height 5\' 2"  (1.575 m), weight 102.1 kg, last menstrual period 12/16/2018, SpO2 100 %.  The patient is sleeping at this time.  There have been no acute events since the last update.  Awaiting disposition plan from Behavioral Medicine team.   Paulette Blanch, MD 01/01/19 [phone]

## 2019-01-01 NOTE — ED Notes (Signed)
Hourly rounding reveals patient in room. No complaints, stable, in no acute distress. Q15 minute rounds and monitoring via Security Cameras to continue. 

## 2019-01-01 NOTE — ED Notes (Signed)
Patient talking with psychiatry 

## 2019-01-01 NOTE — ED Notes (Signed)
Report to include Situation, Background, Assessment, and Recommendations received from Jadeka RN. Patient alert and oriented, warm and dry, in no acute distress. Patient denies SI, HI, AVH and pain. Patient made aware of Q15 minute rounds and security cameras for their safety. Patient instructed to come to me with needs or concerns. 

## 2019-01-01 NOTE — BH Assessment (Signed)
Referral information refaxed for Psychiatric Hospitalization faxed to;   [name] [phone]),   . [facility] [phone]),  . [facility] [phone], [phone], [phone] or [phone]),   . [place] [phone] or [phone])  . [facility] [phone]),   . [facility] [phone]), patient declined.  [facility] [phone]).  [facility] [phone])
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
## 2019-01-01 NOTE — ED Notes (Signed)
IVC/Consult Completed/Pending Placement 

## 2019-01-01 NOTE — ED Notes (Signed)
Patient taking a shower.

## 2019-01-02 NOTE — ED Notes (Signed)
Hourly rounding reveals patient sleeping in room. No complaints, stable, in no acute distress. Q15 minute rounds and monitoring via Security Cameras to continue. 

## 2019-01-02 NOTE — ED Provider Notes (Signed)
-----------------------------------------   4:12 PM on 01/02/2019 -----------------------------------------   Blood pressure 133/85, pulse 79, temperature 98.3 F (36.8 C), temperature source Oral, resp. rate 18, height 5\' 2"  (1.575 m), weight 102.1 kg, last menstrual period 12/16/2018, SpO2 98 %.  The patient is calm and cooperative at this time.  There have been no acute events since the last update.  Awaiting disposition plan from Behavioral Medicine team.    Nena Polio, MD 01/02/19 [phone]

## 2019-01-02 NOTE — BH Assessment (Signed)
Patient has been accepted to [facility].  Patient assigned to [place] physician is Dr. Solon Palm.  Call report to [phone].  Representative was [place].   ER Staff is aware of it:  Vaughan Basta, ER Secretary  Dr. Cinda Quest, ER MD  Donneta Romberg, Patient's Nurse  Address: [address] [place], [place] [address]

## 2019-01-02 NOTE — ED Notes (Signed)
Patient transferred to [facility] hospital with [facility], patient and officer received transfer papers. Patient received belongings and verbalized she has received all of her belongings. Patient appropriate and cooperative, Denies SI/HI AVH. Vital signs taken. NAD noted.

## 2019-01-02 NOTE — BH Assessment (Signed)
Patient's demographic and insurance information updated and referral refaxed.  [name] [phone]),   . Mikel Cella [phone], [phone], [phone] or [phone]),   . [place] [phone] or [phone])  . [facility] [phone]),   . [place] [phone]),   . [facility] [phone])

## 2019-01-02 NOTE — ED Provider Notes (Signed)
Dr. Cinda Quest attending to the care of a critical patient.  Patient is alert and oriented, vital signs are normal.  Patient is understanding and agreeable with plan to be transferred to [place] completed.   Delman Kitten, MD 01/02/19 [phone]

## 2019-01-02 NOTE — ED Provider Notes (Signed)
-----------------------------------------   7:05 AM on 01/02/2019 -----------------------------------------   Blood pressure 133/85, pulse 79, temperature 98.3 F (36.8 C), temperature source Oral, resp. rate 18, height 5\' 2"  (1.575 m), weight 102.1 kg, last menstrual period 12/16/2018, SpO2 98 %.  The patient is calm and cooperative at this time.  There have been no acute events since the last update.  Awaiting disposition plan from Behavioral Medicine team.    Merlyn Lot, MD 01/02/19 [phone]

## 2019-01-18 ENCOUNTER — Encounter: Payer: Self-pay | Admitting: Emergency Medicine

## 2019-01-18 ENCOUNTER — Other Ambulatory Visit: Payer: Self-pay

## 2019-01-18 ENCOUNTER — Emergency Department
Admission: EM | Admit: 2019-01-18 | Discharge: 2019-01-20 | Disposition: A | Discharge status: Other Acute Inpatient Hospital | Payer: Medicaid - Out of State | Attending: Emergency Medicine | Admitting: Emergency Medicine

## 2019-01-18 DIAGNOSIS — Y9289 Other specified places as the place of occurrence of the external cause: Secondary | ICD-10-CM | POA: Insufficient documentation

## 2019-01-18 DIAGNOSIS — Z046 Encounter for general psychiatric examination, requested by authority: Secondary | ICD-10-CM | POA: Diagnosis present

## 2019-01-18 DIAGNOSIS — Y999 Unspecified external cause status: Secondary | ICD-10-CM | POA: Insufficient documentation

## 2019-01-18 DIAGNOSIS — F332 Major depressive disorder, recurrent severe without psychotic features: Secondary | ICD-10-CM | POA: Diagnosis present

## 2019-01-18 DIAGNOSIS — Y9389 Activity, other specified: Secondary | ICD-10-CM | POA: Diagnosis not present

## 2019-01-18 DIAGNOSIS — F17200 Nicotine dependence, unspecified, uncomplicated: Secondary | ICD-10-CM | POA: Insufficient documentation

## 2019-01-18 DIAGNOSIS — Z1159 Encounter for screening for other viral diseases: Secondary | ICD-10-CM | POA: Insufficient documentation

## 2019-01-18 DIAGNOSIS — F603 Borderline personality disorder: Secondary | ICD-10-CM | POA: Diagnosis not present

## 2019-01-18 DIAGNOSIS — X789XXA Intentional self-harm by unspecified sharp object, initial encounter: Secondary | ICD-10-CM | POA: Insufficient documentation

## 2019-01-18 DIAGNOSIS — Z79899 Other long term (current) drug therapy: Secondary | ICD-10-CM | POA: Insufficient documentation

## 2019-01-18 DIAGNOSIS — S50812A Abrasion of left forearm, initial encounter: Secondary | ICD-10-CM | POA: Insufficient documentation

## 2019-01-18 LAB — URINE DRUG SCREEN, QUALITATIVE (ARMC ONLY)
Amphetamines, Ur Screen: NOT DETECTED
Barbiturates, Ur Screen: NOT DETECTED
Benzodiazepine, Ur Scrn: NOT DETECTED
Cannabinoid 50 Ng, Ur ~~LOC~~: NOT DETECTED
Cocaine Metabolite,Ur ~~LOC~~: NOT DETECTED
MDMA (Ecstasy)Ur Screen: NOT DETECTED
Methadone Scn, Ur: NOT DETECTED
Opiate, Ur Screen: NOT DETECTED
Phencyclidine (PCP) Ur S: NOT DETECTED
Tricyclic, Ur Screen: NOT DETECTED

## 2019-01-18 LAB — COMPREHENSIVE METABOLIC PANEL
ALT: 15 U/L (ref 0–44)
AST: 17 U/L (ref 15–41)
Albumin: 4.4 g/dL (ref 3.5–5.0)
Alkaline Phosphatase: 76 U/L (ref 38–126)
Anion gap: 9 (ref 5–15)
BUN: 16 mg/dL (ref 6–20)
CO2: 22 mmol/L (ref 22–32)
Calcium: 9.3 mg/dL (ref 8.9–10.3)
Chloride: 107 mmol/L (ref 98–111)
Creatinine, Ser: 0.61 mg/dL (ref 0.44–1.00)
GFR calc Af Amer: >60 mL/min (ref >60)
GFR calc non Af Amer: >60 mL/min (ref >60)
Glucose, Bld: 101 mg/dL — ABNORMAL HIGH (ref 70–99)
Potassium: 4 mmol/L (ref 3.5–5.1)
Sodium: 138 mmol/L (ref 135–145)
Total Bilirubin: 0.5 mg/dL (ref 0.3–1.2)
Total Protein: 8.1 g/dL (ref 6.5–8.1)

## 2019-01-18 LAB — CBC
HCT: 39.1 % (ref 36.0–46.0)
Hemoglobin: 13.1 g/dL (ref 12.0–15.0)
MCH: 30.8 pg (ref 26.0–34.0)
MCHC: 33.5 g/dL (ref 30.0–36.0)
MCV: 91.8 fL (ref 80.0–100.0)
Platelets: 270 10*3/uL (ref 150–400)
RBC: 4.26 MIL/uL (ref 3.87–5.11)
RDW: 12.2 % (ref 11.5–15.5)
WBC: 11.6 10*3/uL — ABNORMAL HIGH (ref 4.0–10.5)
nRBC: 0 % (ref 0.0–0.2)

## 2019-01-18 LAB — ETHANOL: Alcohol, Ethyl (B): <10 mg/dL (ref <10)

## 2019-01-18 LAB — SALICYLATE LEVEL: Salicylate Lvl: <7 mg/dL (ref 2.8–30.0)

## 2019-01-18 LAB — POCT PREGNANCY, URINE: Preg Test, Ur: NEGATIVE

## 2019-01-18 LAB — ACETAMINOPHEN LEVEL: Acetaminophen (Tylenol), Serum: <10 ug/mL — ABNORMAL LOW (ref 10–30)

## 2019-01-18 MED ORDER — LORAZEPAM 0.5 MG PO TABS
0.5000 mg | ORAL_TABLET | Freq: Once | ORAL | Status: AC
  Administered 2019-01-18: 0.5 mg via ORAL
  Filled 2019-01-18: qty 1

## 2019-01-18 NOTE — ED Notes (Signed)
Pt transferred into ED BHU room 1   Patient assigned to appropriate care area. Patient oriented to unit/care area: Informed that, for her safety, care areas are designed for safety and monitored by security cameras at all times; phone times explained to patient. Patient verbalizes understanding, and verbal contract for safety obtained.   Assessment completed  She is c/o discomfort to her left arm due to self inflicted lacerations

## 2019-01-18 NOTE — ED Notes (Signed)
Pt stated her tongue felt numb and her mouth was "heavy" after taking the PO Ativan. EDP made aware. Will continue to monitor.

## 2019-01-18 NOTE — ED Notes (Signed)
ED BHU [place] Is the patient under IVC or is there intent for IVC: Yes.   Is the patient medically cleared: Yes.   Is there vacancy in the ED BHU: Yes.   Is the population mix appropriate for patient: Yes.   Is the patient awaiting placement in inpatient or outpatient setting: Yes.   Has the patient had a psychiatric consult:  Survey of unit performed for contraband, proper placement and condition of furniture, tampering with fixtures in bathroom, shower, and each patient room: Yes.  ; Findings:  APPEARANCE/BEHAVIOR Calm and cooperative NEURO ASSESSMENT Orientation: oriented x3  Denies pain Hallucinations: No.None noted (Hallucinations)  denies Speech: Normal Gait: normal RESPIRATORY ASSESSMENT Even  Unlabored respirations  CARDIOVASCULAR ASSESSMENT Pulses equal   regular rate  Skin warm and dry   GASTROINTESTINAL ASSESSMENT no GI complaint EXTREMITIES Full ROM  PLAN OF CARE Provide calm/safe environment. Vital signs assessed twice daily. ED BHU Assessment once each 12-hour shift. Assure the ED provider has rounded once each shift. Provide and encourage hygiene. Provide redirection as needed. Assess for escalating behavior; address immediately and inform ED provider.  Assess family dynamic and appropriateness for visitation as needed: Yes.  ; If necessary, describe findings:  Educate the patient/family about BHU procedures/visitation: Yes.  ; If necessary, describe findings:

## 2019-01-18 NOTE — ED Notes (Signed)
RN introduced herself and asked patient a question about what had happened today. Pt stated, "Nothing happened. It's time for me to go home."  Officers at bedside waiting on IVC papers.  Maintained on 15 minute checks

## 2019-01-18 NOTE — ED Notes (Signed)

## 2019-01-18 NOTE — ED Notes (Signed)
BEHAVIORAL HEALTH ROUNDING Patient sleeping: No. Patient alert and oriented: yes Behavior appropriate: Yes.  ; If no, describe:  Nutrition and fluids offered: yes Toileting and hygiene offered: Yes  Sitter present: q15 minute observations and security camera monitoring Law enforcement present: Yes  ODS  

## 2019-01-18 NOTE — ED Triage Notes (Signed)
Pt presents to ED [place] [employer]. Per [place] [employer] pt went to her home to pick up her stuff from her ex-wife, pt then engaged in self-harm behaviors. Per [place] [employer] pt then cut herself 23 times, superficial lacerations noted to L forearm. Per [place] [employer] they had to chase patient to get her to come to hospital. Pt is alert and oriented at this time and cooperative.

## 2019-01-18 NOTE — ED Notes (Signed)
1 hat, 1 pair socks, 1 pair tennis shoes, 1 bra, 1 t-shirt, 1 pair shorts, 1 pair underwear, 1 silicone ring, 1 pair gray silicone gauges, 1 shiny gray chain necklace, 1 black wallet, 1 yellow lanyard with keys, 1 cell phone. This RN and Officer [place] [employer] dressed patient out.l

## 2019-01-18 NOTE — ED Provider Notes (Signed)
[facility] Emergency Department Provider Note   ____________________________________________   First MD Initiated Contact with Patient 01/18/19 1520     (approximate)  I have reviewed the triage vital signs and the nursing notes.   HISTORY  Chief Complaint Psychiatric Evaluation    HPI Natalie Pham is a 18 y.o. female here for evaluation after she became upset was cutting her left arm  Patient reports she and her girlfriend have been going through a break-up.  She just removed her girlfriends close from her home became very upset.  She reports that she started cutting lightly into her left forearm.  She was doing this to relieve stress.  Reports she was very upset told the officer at one point if she had pills she would overdose, but reports she is calm and now she is not having any thoughts she wants to hurt herself or anyone else.  Reports she is got very upset that no to handle the situation well.  Tetanus up-to-date   History reviewed. No pertinent past medical history.  Patient Active Problem List   Diagnosis Date Noted  . Diphenhydramine overdose, intentional self-harm, sequela ([place]) 01/01/2019  . Hallucinations 01/01/2019  . Bipolar disorder ([place]) 01/01/2019  . Seizure ([place]) 12/07/2018    Past Surgical History:  Procedure Laterality Date  . APPENDECTOMY      Prior to Admission medications   Medication Sig Start Date End Date Taking? Authorizing Provider  FLUoxetine (PROZAC) 20 MG capsule Take 40 mg by mouth daily. 01/17/19  Yes [provider]  levETIRAcetam (KEPPRA) 500 MG tablet Take 1 tablet (500 mg total) by mouth 2 (two) times daily. 12/08/18  Yes Dustin Flock, MD  mirtazapine (REMERON) 15 MG tablet Take 15 mg by mouth at bedtime. 01/17/19  Yes [provider]  OLANZapine (ZYPREXA) 5 MG tablet Take 5 mg by mouth at bedtime.   Yes [provider]    Allergies Aripiprazole, [place] [fish allergy], and  Lamotrigine  No family history on file.  Social History Social History   Tobacco Use  . Smoking status: Current Every Day Smoker  . Smokeless tobacco: Never Used  Substance Use Topics  . Alcohol use: Never    Frequency: Never  . Drug use: Never    Review of Systems Constitutional: No fever/chills Eyes: No visual changes. ENT: No sore throat. Cardiovascular: Denies chest pain. Respiratory: Denies shortness of breath.  No known exposure to coronavirus. Gastrointestinal: No abdominal pain.   Genitourinary: Negative for dysuria. Musculoskeletal: Negative for back pain. Skin: Negative for rash septa only on the left forearm where she was cutting herself. Neurological: Negative for headaches, areas of focal weakness or numbness.    ____________________________________________   PHYSICAL EXAM:  VITAL SIGNS: ED Triage Vitals [01/18/19 1314]  Enc Vitals Group     BP 126/70     Pulse Rate 96     Resp 18     Temp 98.9 F (37.2 C)     Temp Source Oral     SpO2 97 %     Weight 225 lb (102.1 kg)     Height 5\' 2"  (1.575 m)     Head Circumference      Peak Flow      Pain Score 7     Pain Loc      Pain Edu?      Excl. in [place]?     Constitutional: Alert and oriented. Well appearing and in no acute distress.  She  is calm and pleasant. Eyes: Conjunctivae are normal. Head: Atraumatic. Nose: No congestion/rhinnorhea. Mouth/Throat: Mucous membranes are moist. Neck: No stridor.  Cardiovascular: Normal rate, regular rhythm. Grossly normal heart sounds.  Good peripheral circulation. Respiratory: Normal respiratory effort.  No retractions. Lungs CTAB. Gastrointestinal: Soft and nontender. No distention. Musculoskeletal: No lower extremity tenderness nor edema. Neurologic:  Normal speech and language. No gross focal neurologic deficits are appreciated.  Skin:  Skin is warm, dry and intact. No rash noted except she has several about 23 cm very superficial linear abrasions overlying  the left forearm, none wide deep or open that would require suturing. Psychiatric: Mood and affect are somewhat flat. Speech and behavior are normal.  ____________________________________________   LABS (all labs ordered are listed, but only abnormal results are displayed)  Labs Reviewed  COMPREHENSIVE METABOLIC PANEL - Abnormal; Notable for the following components:      Result Value   Glucose, Bld 101 (*)    All other components within normal limits  ACETAMINOPHEN LEVEL - Abnormal; Notable for the following components:   Acetaminophen (Tylenol), Serum <10 (*)    All other components within normal limits  CBC - Abnormal; Notable for the following components:   WBC 11.6 (*)    All other components within normal limits  ETHANOL  SALICYLATE LEVEL  URINE DRUG SCREEN, QUALITATIVE ([facility] ONLY)  POC URINE PREG, ED  POCT PREGNANCY, URINE   ____________________________________________  EKG   ____________________________________________  RADIOLOGY   ____________________________________________   PROCEDURES  Procedure(s) performed: None  Procedures  Critical Care performed: No  ____________________________________________   INITIAL IMPRESSION / ASSESSMENT AND PLAN / ED COURSE  Pertinent labs & imaging results that were available during my care of the patient were reviewed by me and considered in my medical decision making (see chart for details).   Patient presents after self-injurious behavior.  Became upset and agitated regarding social situation.  She is calm now, denies wanting herself or anyone else.  She expresses that she did not know how to handle the situation and started cutting it herself and made a statement about overdose but reports she would not do this and she is calm and handling the situation well.  She has appeared calm, appropriate.  She is under IVC from Police Department.  Several very superficial lacerations versus abrasions her left forearm.  None  requiring repair.  Will cleanse and clean these, bandage.  Patient denies ongoing desire to hurt herself or anyone else.  Change to every 15 minute safety checks.  Patiently medically clear for psychiatric evaluation, psych consult placed    Ongoing care and disposition assigned to Dr. Megan Salonsong at 11 PM.  Follow-up on formal psychiatry recommendations.  Patient under IVC  ____________________________________________   FINAL CLINICAL IMPRESSION(S) / ED DIAGNOSES  Final diagnoses:  Abrasion of left forearm, initial encounter  suicidal statements      Note:  This document was prepared using Dragon voice recognition software and may include unintentional dictation errors       Sharyn CreamerQuale, , MD 01/19/19 0012

## 2019-01-18 NOTE — ED Triage Notes (Signed)
Brought by [facility] for IVC.  Patient is cooperative.

## 2019-01-19 DIAGNOSIS — F603 Borderline personality disorder: Secondary | ICD-10-CM | POA: Diagnosis present

## 2019-01-19 DIAGNOSIS — F332 Major depressive disorder, recurrent severe without psychotic features: Secondary | ICD-10-CM

## 2019-01-19 MED ORDER — MIRTAZAPINE 15 MG PO TABS
15.0000 mg | ORAL_TABLET | Freq: Every day | ORAL | Status: DC
  Administered 2019-01-19: 15 mg via ORAL
  Filled 2019-01-19: qty 1

## 2019-01-19 MED ORDER — FLUOXETINE HCL 20 MG PO CAPS: 40.0000 mg | ORAL_CAPSULE | Freq: Every day | ORAL | Status: DC

## 2019-01-19 MED ORDER — OLANZAPINE 5 MG PO TABS
5.0000 mg | ORAL_TABLET | Freq: Every day | ORAL | Status: DC
  Administered 2019-01-19: 5 mg via ORAL
  Filled 2019-01-19: qty 1

## 2019-01-19 MED ORDER — LEVETIRACETAM 500 MG PO TABS
500.0000 mg | ORAL_TABLET | Freq: Two times a day (BID) | ORAL | Status: DC
  Administered 2019-01-19: 500 mg via ORAL
  Filled 2019-01-19 (×2): qty 1

## 2019-01-19 NOTE — Consult Note (Signed)
[facility] Face-to[facility] Consult   Reason for Consult:  Suicide threat Referring Physician:  EDP Patient Identification: Natalie Pham MRN:  [id number] Principal Diagnosis: Major depressive disorder, recurrent, severe without psychotic features (HCC) Diagnosis:  Principal Problem:   Major depressive disorder, recurrent, severe without psychotic features (HCC) Active Problems:   Borderline personality disorder (HCC)   Total Time spent with patient: 1 hour  Subjective:   Natalie Pham is a 18 y.o. female patient admitted with suicide threat.  "I got upset because I moved my things out of my exwife's house and cut myself."  She reports [facility], discharged and went to a hospital in [place], discharged and is now in this ED.  Did not follow up with her outpatient appointments, "the hospitals did nothing for me," asked about utilizing her coping skills.  The last time this provider saw her, her mother was convinced she was going to kill herself and was hysterical.  She is supposed to return to live with her parents who are supportive.  Borderline personality traits dominant the assessment, high risk for self harm due to her manipulative behaviors.  HPI:  Per ED admission note:   18 y.o. female here for evaluation after she became upset was cutting her left arm  Patient reports she and her girlfriend have been going through a break-up.  She just removed her girlfriends close from her home became very upset.  She reports that she started cutting lightly into her left forearm.  She was doing this to relieve stress.  Reports she was very upset told the officer at one point if she had pills she would overdose, but reports she is calm and now she is not having any thoughts she wants to hurt herself or anyone else.  Reports she is got very upset that no to handle the situation well.  Past Psychiatric History: depression, borderline personality  Risk to Self:  yes Risk to Others:  none Prior  Inpatient Therapy:  Yes, multiple Prior Outpatient Therapy:  No  Past Medical History: History reviewed. No pertinent past medical history.  Past Surgical History:  Procedure Laterality Date  . APPENDECTOMY     Family History: No family history on file. Family Psychiatric  History: none Social History:  Social History   Substance and Sexual Activity  Alcohol Use Never  . Frequency: Never     Social History   Substance and Sexual Activity  Drug Use Never    Social History   Socioeconomic History  . Marital status: Media plannerDomestic Partner    Spouse name: Not on file  . Number of children: Not on file  . Years of education: Not on file  . Highest education level: Not on file  Occupational History  . Not on file  Social Needs  . Financial resource strain: Not on file  . Food insecurity    Worry: Not on file    Inability: Not on file  . Transportation needs    Medical: Not on file    Non-medical: Not on file  Tobacco Use  . Smoking status: Current Every Day Smoker  . Smokeless tobacco: Never Used  Substance and Sexual Activity  . Alcohol use: Never    Frequency: Never  . Drug use: Never  . Sexual activity: Not on file  Lifestyle  . Physical activity    Days per week: Not on file    Minutes per session: Not on file  . Stress: Not on file  Relationships  . Social Musicianconnections    Talks on phone: Not on file    Gets together: Not on file    Attends religious service: Not on file    Active member of club or organization: Not on file    Attends meetings of clubs or organizations: Not on file    Relationship status: Not on file  Other Topics Concern  . Not on file  Social History Narrative  . Not on file   Additional Social History:    Allergies:   Allergies  Allergen Reactions  . Aripiprazole Rash and Hives  . [facility][Fish Allergy] Other (See Comments)    Tongue Swelling  . Lamotrigine     Hallucinations     Labs:  Results for orders placed or performed  during the hospital encounter of 01/18/19 (from the past 48 hour(s))  Comprehensive metabolic panel     Status: Abnormal   Collection Time: 01/18/19  1:23 PM  Result Value Ref Range   Sodium 138 135 - 145 mmol/L   Potassium 4.0 3.5 - 5.1 mmol/L   Chloride 107 98 - 111 mmol/L   CO2 22 22 - 32 mmol/L   Glucose, Bld 101 (H) 70 - 99 mg/dL   BUN 16 6 - 20 mg/dL   Creatinine, Ser 1.610.61 0.44 - 1.00 mg/dL   Calcium 9.3 8.9 - 09.610.3 mg/dL   Total Protein 8.1 6.5 - 8.1 g/dL   Albumin 4.4 3.5 - 5.0 g/dL   AST 17 15 - 41 U/L   ALT 15 0 - 44 U/L   Alkaline Phosphatase 76 38 - 126 U/L   Total Bilirubin 0.5 0.3 - 1.2 mg/dL   GFR calc non Af Amer >60 >60 mL/min   GFR calc Af Amer >60 >60 mL/min   Anion gap 9 5 - 15    Comment: Performed at [facility], [address] [place], [place] [address]  Ethanol     Status: None   Collection Time: 01/18/19  1:23 PM  Result Value Ref Range   Alcohol, Ethyl (B) <10 <10 mg/dL    Comment: (NOTE) Lowest detectable limit for serum alcohol is 10 mg/dL. For medical purposes only. Performed at [facility], [address] [place], [place] [address]   Salicylate level     Status: None   Collection Time: 01/18/19  1:23 PM  Result Value Ref Range   Salicylate Lvl <7.0 2.8 - 30.0 mg/dL    Comment: Performed at [facility], [address] [place], [place] [address]  Acetaminophen level     Status: Abnormal   Collection Time: 01/18/19  1:23 PM  Result Value Ref Range   Acetaminophen (Tylenol), Serum <10 (L) 10 - 30 ug/mL    Comment: (NOTE) Therapeutic concentrations vary significantly. A range of 10-30 ug/mL  may be an effective concentration for many patients. However, some  are best treated at concentrations outside of this range. Acetaminophen concentrations >150 ug/mL at 4 hours after ingestion  and >50 ug/mL at 12 hours after ingestion are often associated with  toxic reactions. Performed at [facility], [address] [place], [place] [address]   cbc     Status: Abnormal   Collection Time: 01/18/19  1:23 PM  Result Value Ref Range   WBC 11.6 (H) 4.0 - 10.5 K/uL   RBC 4.26 3.87 - 5.11 MIL/uL   Hemoglobin 13.1 12.0 - 15.0 g/dL   HCT 30.839.1 65.736.0 - 84.646.0 %  MCV 91.8 80.0 - 100.0 fL   MCH 30.8 26.0 - 34.0 pg   MCHC 33.5 30.0 - 36.0 g/dL   RDW 04.512.2 40.911.5 - 81.115.5 %   Platelets 270 150 - 400 K/uL   nRBC 0.0 0.0 - 0.2 %    Comment: Performed at [facility], [address] [place], [place] [address]  Urine Drug Screen, Qualitative     Status: None   Collection Time: 01/18/19  1:23 PM  Result Value Ref Range   Tricyclic, Ur Screen NONE DETECTED NONE DETECTED   Amphetamines, Ur Screen NONE DETECTED NONE DETECTED   MDMA (Ecstasy)Ur Screen NONE DETECTED NONE DETECTED   Cocaine Metabolite,Ur [place] NONE DETECTED NONE DETECTED   Opiate, Ur Screen NONE DETECTED NONE DETECTED   Phencyclidine (PCP) Ur S NONE DETECTED NONE DETECTED   Cannabinoid 50 Ng, Ur [place] NONE DETECTED NONE DETECTED   Barbiturates, Ur Screen NONE DETECTED NONE DETECTED   Benzodiazepine, Ur Scrn NONE DETECTED NONE DETECTED   Methadone Scn, Ur NONE DETECTED NONE DETECTED    Comment: (NOTE) Tricyclics + metabolites, urine    Cutoff 1000 ng/mL Amphetamines + metabolites, urine  Cutoff 1000 ng/mL MDMA (Ecstasy), urine              Cutoff 500 ng/mL Cocaine Metabolite, urine          Cutoff 300 ng/mL Opiate + metabolites, urine        Cutoff 300 ng/mL Phencyclidine (PCP), urine         Cutoff 25 ng/mL Cannabinoid, urine                 Cutoff 50 ng/mL Barbiturates + metabolites, urine  Cutoff 200 ng/mL Benzodiazepine, urine              Cutoff 200 ng/mL Methadone, urine                   Cutoff 300 ng/mL The urine drug screen provides only a preliminary, unconfirmed analytical test result and should not be used for non-medical purposes. Clinical consideration and professional judgment should be applied to any positive drug  screen result due to possible interfering substances. A more specific alternate chemical method must be used in order to obtain a confirmed analytical result. Gas chromatography / mass spectrometry (GC/MS) is the preferred confirmat ory method. Performed at [facility], [address] [place], [place] [address]   Pregnancy, urine POC     Status: None   Collection Time: 01/18/19  1:44 PM  Result Value Ref Range   Preg Test, Ur NEGATIVE NEGATIVE    Comment:        THE SENSITIVITY OF THIS METHODOLOGY IS >24 mIU/mL     No current facility-administered medications for this encounter.    Current Outpatient Medications  Medication Sig Dispense Refill  . FLUoxetine (PROZAC) 20 MG capsule Take 40 mg by mouth daily.    Marland Kitchen. levETIRAcetam (KEPPRA) 500 MG tablet Take 1 tablet (500 mg total) by mouth 2 (two) times daily. 60 tablet 2  . mirtazapine (REMERON) 15 MG tablet Take 15 mg by mouth at bedtime.    Marland Kitchen. OLANZapine (ZYPREXA) 5 MG tablet Take 5 mg by mouth at bedtime.      Musculoskeletal: Strength & Muscle Tone: within normal limits Gait & Station: normal Patient leans: N/A  Psychiatric Specialty Exam: Physical Exam  Nursing note and vitals reviewed. Constitutional: She is oriented to person, place, and time. She appears well-developed and well-nourished.  HENT:  Head: Normocephalic.  Neck: Normal range of motion.  Respiratory: Effort normal.  Musculoskeletal: Normal range of motion.  Neurological: She is alert and oriented to person, place, and time.  Psychiatric: Her speech is normal and behavior is normal. Her mood appears anxious. Her affect is blunt. Cognition and memory are normal. She expresses impulsivity. She exhibits a depressed mood. She expresses suicidal ideation. She expresses suicidal plans.    Review of Systems  Psychiatric/Behavioral: Positive for depression and suicidal ideas. The patient is nervous/anxious.   All other systems reviewed and are  negative.   Blood pressure 113/62, pulse 81, temperature 98 F (36.7 C), temperature source Oral, resp. rate 16, height 5\' 2"  (1.575 m), weight 102.1 kg, SpO2 98 %.Body mass index is 41.15 kg/m.  General Appearance: casual  Eye Contact:  Fair  Speech:  Clear and Coherent  Volume:  Decreased  Mood:  Anxious and Depressed  Affect:  Blunt  Thought Process:  Coherent  Orientation:  Full (Time, Place, and Person)  Thought Content:  WDL and Logical  Suicidal Thoughts:  Yes.  with intent/plan  Homicidal Thoughts:  No  Memory:  Immediate;   Fair Recent;   Fair Remote;   Fair  Judgement:  Poor  Insight:  Fair  Psychomotor Activity:  Normal  Concentration:  Concentration: Fair and Attention Span: Fair  Recall:  [employer] of Knowledge:  Fair  Language:  Fair  Akathisia:  No  Handed:  Right  AIMS (if indicated):     Assets:  Leisure Time Physical Health Resilience  ADL's:  Intact  Cognition:  WNL  Sleep:        Treatment Plan Summary: Daily contact with patient to assess and evaluate symptoms and progress in treatment, Medication management and Plan Major depressive disorder, recurrent, severe withouth psychosis:  -Continued her Prozac 40 mg daily -Continued her Zyprexa 5 mg at bedtime  Seizure d/o: -Continued her Keppra 500 mg BID  Insomnia: -Continued her Remeron 15 mg at bedtime  Disposition: Recommend psychiatric Inpatient admission when medically cleared.  Waylan Boga, NP 01/19/2019 10:23 PM

## 2019-01-19 NOTE — ED Notes (Signed)
Patient ate 100% of dinner and  Beverage, no signs of distress, will continue to monitor.

## 2019-01-19 NOTE — ED Provider Notes (Signed)
-----------------------------------------   6:25 AM on 01/19/2019 -----------------------------------------   Blood pressure 113/62, pulse 81, temperature 98 F (36.7 C), temperature source Oral, resp. rate 16, height 5\' 2"  (1.575 m), weight 102.1 kg, SpO2 98 %.  The patient is calm and cooperative at this time.  There have been no acute events since the last update.  Awaiting disposition plan from Behavioral Medicine team.   Paulette Blanch, MD 01/19/19 [phone]

## 2019-01-19 NOTE — ED Notes (Signed)
Psych consult complete, pending disposition

## 2019-01-19 NOTE — ED Notes (Signed)
Pt. Woke up and wanted to talk to this nurse.  Pt. States "I do not feel safe being discharged tomorrow".  Patient asked, "Is anyone threatening you?"  Pt. Denies this.  Pt. States "I think I need to be admitted".  Pt. Also requesting daily medications. Told patient that I would check on medications and inpatient status.  Pt. Calm and cooperative.

## 2019-01-19 NOTE — ED Notes (Signed)
TTS talking to patient at this time in [place] #1.

## 2019-01-19 NOTE — ED Notes (Signed)
IVC PENDING  CONSULT ?
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
## 2019-01-19 NOTE — ED Notes (Signed)
Pt provided with meal tray and ginger ale to drink, pt sitting up in bed at this time eating meal tray with no further needs or concerns 

## 2019-01-19 NOTE — ED Notes (Signed)
Pt. Currently resting in bed.

## 2019-01-20 ENCOUNTER — Other Ambulatory Visit: Payer: Self-pay

## 2019-01-20 ENCOUNTER — Inpatient Hospital Stay
Admit: 2019-01-20 | Discharge: 2019-01-22 | DRG: 885 | Disposition: A | Discharge status: Home / Self Care | Payer: Medicaid - Out of State | Source: Ambulatory Visit | Attending: Psychiatry | Admitting: Psychiatry

## 2019-01-20 DIAGNOSIS — X789XXA Intentional self-harm by unspecified sharp object, initial encounter: Secondary | ICD-10-CM

## 2019-01-20 DIAGNOSIS — Z91013 Allergy to seafood: Secondary | ICD-10-CM

## 2019-01-20 DIAGNOSIS — F17200 Nicotine dependence, unspecified, uncomplicated: Secondary | ICD-10-CM | POA: Diagnosis present

## 2019-01-20 DIAGNOSIS — F603 Borderline personality disorder: Secondary | ICD-10-CM | POA: Diagnosis present

## 2019-01-20 DIAGNOSIS — F332 Major depressive disorder, recurrent severe without psychotic features: Secondary | ICD-10-CM | POA: Diagnosis not present

## 2019-01-20 DIAGNOSIS — Z888 Allergy status to other drugs, medicaments and biological substances status: Secondary | ICD-10-CM

## 2019-01-20 DIAGNOSIS — S61519A Laceration without foreign body of unspecified wrist, initial encounter: Secondary | ICD-10-CM

## 2019-01-20 DIAGNOSIS — F329 Major depressive disorder, single episode, unspecified: Secondary | ICD-10-CM | POA: Diagnosis present

## 2019-01-20 DIAGNOSIS — Z915 Personal history of self-harm: Secondary | ICD-10-CM

## 2019-01-20 DIAGNOSIS — Z1159 Encounter for screening for other viral diseases: Secondary | ICD-10-CM | POA: Diagnosis not present

## 2019-01-20 LAB — SARS CORONAVIRUS 2 BY RT PCR (HOSPITAL ORDER, PERFORMED IN ~~LOC~~ HOSPITAL LAB): SARS Coronavirus 2: NEGATIVE

## 2019-01-20 MED ORDER — MIRTAZAPINE 15 MG PO TABS
15.0000 mg | ORAL_TABLET | Freq: Every day | ORAL | Status: DC
  Administered 2019-01-20 – 2019-01-21 (×2): 15 mg via ORAL
  Filled 2019-01-20 (×2): qty 1

## 2019-01-20 MED ORDER — GABAPENTIN 600 MG PO TABS
300.0000 mg | ORAL_TABLET | Freq: Two times a day (BID) | ORAL | Status: DC
  Administered 2019-01-20 – 2019-01-22 (×6): 300 mg via ORAL
  Filled 2019-01-20 (×6): qty 1

## 2019-01-20 MED ORDER — LEVETIRACETAM 500 MG PO TABS
500.0000 mg | ORAL_TABLET | Freq: Two times a day (BID) | ORAL | Status: DC
  Administered 2019-01-20 – 2019-01-22 (×6): 500 mg via ORAL
  Filled 2019-01-20 (×6): qty 1

## 2019-01-20 MED ORDER — ALUM & MAG HYDROXIDE-SIMETH 200-200-20 MG/5ML PO SUSP: 30.0000 mL | ORAL | Status: DC | PRN

## 2019-01-20 MED ORDER — HYDROXYZINE HCL 50 MG PO TABS
50.0000 mg | ORAL_TABLET | Freq: Four times a day (QID) | ORAL | Status: DC | PRN
  Administered 2019-01-20 – 2019-01-21 (×2): 50 mg via ORAL
  Filled 2019-01-20 (×2): qty 1

## 2019-01-20 MED ORDER — FLUOXETINE HCL 20 MG PO CAPS
40.0000 mg | ORAL_CAPSULE | Freq: Every day | ORAL | Status: DC
  Administered 2019-01-20 – 2019-01-22 (×3): 40 mg via ORAL
  Filled 2019-01-20 (×3): qty 2

## 2019-01-20 MED ORDER — OLANZAPINE 10 MG PO TABS
10.0000 mg | ORAL_TABLET | Freq: Every day | ORAL | Status: DC
  Administered 2019-01-20 – 2019-01-21 (×2): 10 mg via ORAL
  Filled 2019-01-20 (×2): qty 1

## 2019-01-20 MED ORDER — MAGNESIUM HYDROXIDE 400 MG/5ML PO SUSP: 30.0000 mL | Freq: Every day | ORAL | Status: DC | PRN

## 2019-01-20 MED ORDER — ACETAMINOPHEN 325 MG PO TABS: 650.0000 mg | ORAL_TABLET | Freq: Four times a day (QID) | ORAL | Status: DC | PRN

## 2019-01-20 MED ORDER — OLANZAPINE 5 MG PO TABS: 5.0000 mg | ORAL_TABLET | Freq: Every day | ORAL | Status: DC
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
## 2019-01-20 NOTE — BHH Group Notes (Signed)
LCSW Group Therapy Note   01/20/2019 1:15pm   Type of Therapy and Topic:  Group Therapy:  Trust and Honesty  Participation Level:  Active  Description of Group:    In this group patients will be asked to explore the value of being honest.  Patients will be guided to discuss their thoughts, feelings, and behaviors related to honesty and trusting in others. Patients will process together how trust and honesty relate to forming relationships with peers, family members, and self. Each patient will be challenged to identify and express feelings of being vulnerable. Patients will discuss reasons why people are dishonest and identify alternative outcomes if one was truthful (to self or others). This group will be process-oriented, with patients participating in exploration of their own experiences, giving and receiving support, and processing challenge from other group members.   Therapeutic Goals: 1. Patient will identify why honesty is important to relationships and how honesty overall affects relationships.  2. Patient will identify a situation where they lied or were lied too and the  feelings, thought process, and behaviors surrounding the situation 3. Patient will identify the meaning of being vulnerable, how that feels, and how that correlates to being honest with self and others. 4. Patient will identify situations where they could have told the truth, but instead lied and explain reasons of dishonesty.   Summary of Patient Progress The patient was able to explore the value of being honest.  Patient discussed thoughts, feelings, and behaviors related to honesty and trusting in others. The patient processed together with other group members how trust and honesty relate to forming relationships with peers, family members, and self. Pt actively and appropriately engaged in the group. Patient was able to provide support and validation to other group members. Patient practiced active listening when  interacting with the facilitator and other group members.    Therapeutic Modalities:   Cognitive Behavioral Therapy Solution Focused Therapy Motivational Interviewing Brief Therapy  Nazeer Romney  CUEBAS-COLON, LCSW 01/20/2019 12:36 PM  
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
## 2019-01-20 NOTE — Tx Team (Signed)
Initial Treatment Plan 01/20/2019 4:18 AM Natalie Pham NIO:[id number]    PATIENT STRESSORS: Marital or family conflict   PATIENT STRENGTHS: Average or above average intelligence General fund of knowledge Motivation for treatment/growth   PATIENT IDENTIFIED PROBLEMS:       Depression   Suicidal ideation             DISCHARGE CRITERIA:  Ability to meet basic life and health needs Adequate post-discharge living arrangements Improved stabilization in mood, thinking, and/or behavior Medical problems require only outpatient monitoring Motivation to continue treatment in a less acute level of care Need for constant or close observation no longer present Reduction of life-threatening or endangering symptoms to within safe limits Safe-care adequate arrangements made Verbal commitment to aftercare and medication compliance  PRELIMINARY DISCHARGE PLAN: Outpatient therapy Return to previous living arrangement  PATIENT/FAMILY INVOLVEMENT: This treatment plan has been presented to and reviewed with the patient, Natalie Pham, and/or family member.  The patient and family have been given the opportunity to ask questions and make suggestions.  Libby Maw, RN 01/20/2019, 4:18 AM
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
## 2019-01-20 NOTE — Plan of Care (Signed)
Patient was in bed till lunch time.Patient was in the milieu with no triggering reason patient states "I have social anxiety.I need some strong medicine".When Vistaril offered patient cursed and took the pill "not going to do anything".Patient calm down and appropriate in the unit at this time.Denies SI,HI and AVH.Appetite and energy level good.Compliant with medications.Support and encouragement given.
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
## 2019-01-21 NOTE — Progress Notes (Signed)
Patient returned to her room to take a shower. Cooperative and rates her depression at 6/10. Mood improving as evidenced by improved eye contact and  decreased irritability. Thought process organized. Patient is alert and oriented and denying thoughts of self harm. Denying pain/discomfort. Her goal today is to improve coping skills. Patient is encouraged to communicate her thoughts and feelings as needed. Emotional support provided. Safety monitored per unit protocol.

## 2019-01-21 NOTE — Progress Notes (Signed)
Patient has been in the milieu since dinner time. Has been sitting in the dayroom with peers. Calm and cooperative. Received dinner and has just reported that she ate well. At medication time, patient presented to the medication room. Discussed her medication regime and stated that  Levetracetam is not helping her. Also mentioned that she would like to discuss medication regime with MD for an a mood  stabilizer.  Beside medications, patient talked about her recent separation with wife, which triggered her depression and self harm thoughts. Patient is blaming herself but willing to get some help and move on. She reports that she has a supportive family, that she is currently residing at her mother's house. Patient became more and more pleasant as conversation advanced. Requested Vistaril reporting that she wants to control her mood and anxiety before it gets worse. She is denying thoughts of self harm.She has no hallucinations. Patient was encouraged to talk her pain out as needed. Safety precautions reinforced.

## 2019-01-21 NOTE — B[facility] Group Notes (Signed)
LCSW Group Therapy Note 01/21/2019 1:15pm  Type of Therapy and Topic: Group Therapy: Feelings Around Returning Home & Establishing a Supportive Framework and Supporting Oneself When Supports Not Available  Participation Level: Active  Description of Group:  Patients first processed thoughts and feelings about upcoming discharge. These included fears of upcoming changes, lack of change, new living environments, judgements and expectations from others and overall stigma of mental health issues. The group then discussed the definition of a supportive framework, what that looks and feels like, and how do to discern it from an unhealthy non-supportive network. The group identified different types of supports as well as what to do when your family/friends are less than helpful or unavailable  Therapeutic Goals  1. Patient will identify one healthy supportive network that they can use at discharge. 2. Patient will identify one factor of a supportive framework and how to tell it from an unhealthy network. 3. Patient able to identify one coping skill to use when they do not have positive supports from others. 4. Patient will demonstrate ability to communicate their needs through discussion and/or role plays.  Summary of Patient Progress:  The patient reported she feels "good." Pt engaged during group session. As patients processed their anxiety about discharge and described healthy supports patient shared she is ready to be discharge.  Patients identified at least one self-care tool they were willing to use after discharge.   Therapeutic Modalities Cognitive Behavioral Therapy Motivational Interviewing   Cheree Ditto, LCSW 01/21/2019 12:43 PM

## 2019-01-21 NOTE — Plan of Care (Signed)
Natalie Pham remains sad and depressed on approach, she voiced to writer she was ready to be discharged. She was visible in the milieu and she was cooperative with others. She appears to be in bed resting at this time.

## 2019-01-21 NOTE — Plan of Care (Signed)
Patient was in bed through out this shift and only come out for meals and snacks, complies with her medication regimen and request no PRN's. Patient denies any SI,HI,and AVH at this time, support and encouragement is provided , encourage to participate in groups with peers to improve mental and emotional status, appetite is good , affect and mood are subdued with depressed state, patient is monitored frequently and every 15 minutes for safety no distress.   Problem: Education: Goal: Knowledge of [facility] Education information/materials will improve Outcome: Progressing Goal: Emotional status will improve Outcome: Progressing Goal: Mental status will improve Outcome: Progressing Goal: Verbalization of understanding the information provided will improve Outcome: Progressing   Problem: Activity: Goal: Interest or engagement in activities will improve Outcome: Progressing Goal: Sleeping patterns will improve Outcome: Progressing   Problem: Coping: Goal: Ability to verbalize frustrations and anger appropriately will improve Outcome: Progressing Goal: Ability to demonstrate self-control will improve Outcome: Progressing   Problem: Health Behavior/Discharge Planning: Goal: Identification of resources available to assist in meeting health care needs will improve Outcome: Progressing Goal: Compliance with treatment plan for underlying cause of condition will improve Outcome: Progressing   Problem: Physical Regulation: Goal: Ability to maintain clinical measurements within normal limits will improve Outcome: Progressing   Problem: Safety: Goal: Periods of time without injury will increase Outcome: Progressing   Problem: Education: Goal: Utilization of techniques to improve thought processes will improve Outcome: Progressing Goal: Knowledge of the prescribed therapeutic regimen will improve Outcome: Progressing   Problem: Activity: Goal: Interest or engagement in leisure  activities will improve Outcome: Progressing Goal: Imbalance in normal sleep/wake cycle will improve Outcome: Progressing   Problem: Coping: Goal: Coping ability will improve Outcome: Progressing Goal: Will verbalize feelings Outcome: Progressing   Problem: Health Behavior/Discharge Planning: Goal: Ability to make decisions will improve Outcome: Progressing Goal: Compliance with therapeutic regimen will improve Outcome: Progressing   Problem: Role Relationship: Goal: Will demonstrate positive changes in social behaviors and relationships Outcome: Progressing   Problem: Safety: Goal: Ability to disclose and discuss suicidal ideas will improve Outcome: Progressing Goal: Ability to identify and utilize support systems that promote safety will improve Outcome: Progressing   Problem: Self-Concept: Goal: Will verbalize positive feelings about self Outcome: Progressing Goal: Level of anxiety will decrease Outcome: Progressing
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
## 2019-01-21 NOTE — Plan of Care (Signed)
Stayed in room but currently attending group. Denying thoughts of self harm. Denying hallucinations. Sad and depressed but reporting slight improvement.

## 2019-01-21 NOTE — Progress Notes (Signed)
[facility] MD Progress Note  01/21/2019 9:31 AM Natalie Pham  MRN:  [id number] Subjective:  Patient in bed somewhat passive denies current thoughts of harming self states it was an impulsive act and wants to get going denies any side effects from her medications, fluoxetine is at 40 mg at present.  Reports she slept well.  Denies any auditory or visual hallucinations denies thoughts of harming her ex-partner Principal Problem:  Diagnosis: Active Problems:   Major depressive disorder, recurrent severe without psychotic features ([place])  Total Time spent with patient: 20 minutes  Past Psychiatric History: Borderline/bipolar  Past Medical History: History reviewed. No pertinent past medical history.  Past Surgical History:  Procedure Laterality Date  . APPENDECTOMY     Family History: History reviewed. No pertinent family history. Family Psychiatric  History: no new data Social History:  Social History   Substance and Sexual Activity  Alcohol Use Never  . Frequency: Never     Social History   Substance and Sexual Activity  Drug Use Never    Social History   Socioeconomic History  . Marital status: Soil scientist    Spouse name: Not on file  . Number of children: Not on file  . Years of education: Not on file  . Highest education level: Not on file  Occupational History  . Not on file  Social Needs  . Financial resource strain: Not on file  . Food insecurity    Worry: Not on file    Inability: Not on file  . Transportation needs    Medical: Not on file    Non-medical: Not on file  Tobacco Use  . Smoking status: Current Every Day Smoker  . Smokeless tobacco: Never Used  Substance and Sexual Activity  . Alcohol use: Never    Frequency: Never  . Drug use: Never  . Sexual activity: Not on file  Lifestyle  . Physical activity    Days per week: Not on file    Minutes per session: Not on file  . Stress: Not on file  Relationships  . Social Herbalist on phone:  Not on file    Gets together: Not on file    Attends religious service: Not on file    Active member of club or organization: Not on file    Attends meetings of clubs or organizations: Not on file    Relationship status: Not on file  Other Topics Concern  . Not on file  Social History Narrative  . Not on file   Additional Social History:                         Sleep: Good  Appetite:  Good  Current Medications: Current Facility-Administered Medications  Medication Dose Route Frequency Provider Last Rate Last Dose  . acetaminophen (TYLENOL) tablet 650 mg  650 mg Oral Q6H PRN Patrecia Pour, NP      . alum & mag hydroxide-simeth (MAALOX/MYLANTA) 200-200-20 MG/5ML suspension 30 mL  30 mL Oral Q4H PRN Patrecia Pour, NP      . FLUoxetine (PROZAC) capsule 40 mg  40 mg Oral Daily Patrecia Pour, NP   40 mg at 01/21/19 0758  . gabapentin (NEURONTIN) tablet 300 mg  300 mg Oral BID Johnn Hai, MD   300 mg at 01/21/19 0758  . hydrOXYzine (ATARAX/VISTARIL) tablet 50 mg  50 mg Oral Q6H PRN Johnn Hai, MD   50 mg at  01/20/19 1510  . levETIRAcetam (KEPPRA) tablet 500 mg  500 mg Oral BID Charm RingsLord, Jamison Y, NP   500 mg at 01/21/19 0758  . magnesium hydroxide (MILK OF MAGNESIA) suspension 30 mL  30 mL Oral Daily PRN Charm RingsLord, Jamison Y, NP      . mirtazapine (REMERON) tablet 15 mg  15 mg Oral QHS Charm RingsLord, Jamison Y, NP   15 mg at 01/20/19 2138  . OLANZapine (ZYPREXA) tablet 10 mg  10 mg Oral QHS Malvin JohnsFarah, Vikkie Goeden, MD   10 mg at 01/20/19 2138    Lab Results:  Results for orders placed or performed during the hospital encounter of 01/18/19 (from the past 48 hour(s))  SARS Coronavirus 2 (CEPHEID - Performed in [facility] hospital lab), Hosp Order     Status: None   Collection Time: 01/20/19  1:28 AM   Specimen: Nasopharyngeal Swab  Result Value Ref Range   SARS Coronavirus 2 NEGATIVE NEGATIVE    Comment: (NOTE) If result is NEGATIVE SARS-CoV-2 target nucleic acids are NOT DETECTED. The  SARS-CoV-2 RNA is generally detectable in upper and lower  respiratory specimens during the acute phase of infection. The lowest  concentration of SARS-CoV-2 viral copies this assay can detect is 250  copies / mL. A negative result does not preclude SARS-CoV-2 infection  and should not be used as the sole basis for treatment or other  patient management decisions.  A negative result may occur with  improper specimen collection / handling, submission of specimen other  than nasopharyngeal swab, presence of viral mutation(s) within the  areas targeted by this assay, and inadequate number of viral copies  (<250 copies / mL). A negative result must be combined with clinical  observations, patient history, and epidemiological information. If result is POSITIVE SARS-CoV-2 target nucleic acids are DETECTED. The SARS-CoV-2 RNA is generally detectable in upper and lower  respiratory specimens dur ing the acute phase of infection.  Positive  results are indicative of active infection with SARS-CoV-2.  Clinical  correlation with patient history and other diagnostic information is  necessary to determine patient infection status.  Positive results do  not rule out bacterial infection or co-infection with other viruses. If result is PRESUMPTIVE POSTIVE SARS-CoV-2 nucleic acids MAY BE PRESENT.   A presumptive positive result was obtained on the submitted specimen  and confirmed on repeat testing.  While 2019 novel coronavirus  (SARS-CoV-2) nucleic acids may be present in the submitted sample  additional confirmatory testing may be necessary for epidemiological  and / or clinical management purposes  to differentiate between  SARS-CoV-2 and other Sarbecovirus currently known to infect humans.  If clinically indicated additional testing with an alternate test  methodology [phone]([id number]) is advised. The SARS-CoV-2 RNA is generally  detectable in upper and lower respiratory sp ecimens during the acute   phase of infection. The expected result is Negative. Fact Sheet for Patients:  BoilerBrush.com.cyhttps://www.fda.gov/media/136312/download Fact Sheet for Healthcare Providers: https://pope.com/https://www.fda.gov/media/136313/download This test is not yet approved or cleared by the [place] FDA and has been authorized for detection and/or diagnosis of SARS-CoV-2 by FDA under an Emergency Use Authorization (EUA).  This EUA will remain in effect (meaning this test can be used) for the duration of the COVID-19 declaration under Section 564(b)(1) of the Act, 21 U.S.C. section 360bbb-3(b)(1), unless the authorization is terminated or revoked sooner. Performed at [facility], [address] [place], [place] [address]     Blood Alcohol level:  Lab Results  Component Value Date  ETH <10 01/18/2019   ETH <10 12/30/2018    Metabolic Disorder Labs: No results found for: HGBA1C, MPG No results found for: PROLACTIN No results found for: CHOL, TRIG, HDL, CHOLHDL, VLDL, LDLCALC  Physical Findings: AIMS: Facial and Oral Movements Muscles of Facial Expression: None, normal Lips and Perioral Area: None, normal Jaw: None, normal Tongue: None, normal,Extremity Movements Upper (arms, wrists, hands, fingers): None, normal Lower (legs, knees, ankles, toes): None, normal, Trunk Movements Neck, shoulders, hips: None, normal, Overall Severity Severity of abnormal movements (highest score from questions above): None, normal Incapacitation due to abnormal movements: None, normal Patient's awareness of abnormal movements (rate only patient's report): No Awareness, Dental Status Current problems with teeth and/or dentures?: No Does patient usually wear dentures?: No  CIWA:    COWS:     Musculoskeletal: Strength & Muscle Tone: within normal limits Gait & Station: normal Patient leans: N/A  Psychiatric Specialty Exam: Physical Exam  ROS  Blood pressure 138/77, pulse (!) 51, temperature 97.7 F (36.5 C),  temperature source Oral, resp. rate 18, height 5\' 2"  (1.575 m), weight 102.1 kg, SpO2 100 %.Body mass index is 41.15 kg/m.  General Appearance: Disheveled  Eye Contact:  Minimal  Speech:  Slow  Volume:  Decreased  Mood:  Dysphoric  Affect:  Congruent  Thought Process:  Goal Directed and Descriptions of Associations: Intact  Orientation:  Full (Time, Place, and Person)  Thought Content:  Logical  Suicidal Thoughts:  No  Homicidal Thoughts:  No  Memory:  Immediate;   Fair  Judgement:  Fair  Insight:  Fair  Psychomotor Activity:  Normal  Concentration:  Concentration: Fair  Recall:  FiservFair  Fund of Knowledge:  Fair  Language:  Fair  Akathisia:  Negative  Handed:  Right  AIMS (if indicated):     Assets:  Communication Skills Desire for Improvement Physical Health Resilience  ADL's:  Intact  Cognition:  WNL  Sleep:        Treatment Plan Summary: Daily contact with patient to assess and evaluate symptoms and progress in treatment, Medication management and Plan Continue basic mood stabilizer therapy, cognitive therapy, no change in precautions continue to monitor  Malvin JohnsFARAH,Lawernce Earll, MD 01/21/2019, 9:31 AM

## 2019-01-22 DIAGNOSIS — S61519A Laceration without foreign body of unspecified wrist, initial encounter: Secondary | ICD-10-CM

## 2019-01-22 DIAGNOSIS — X789XXA Intentional self-harm by unspecified sharp object, initial encounter: Secondary | ICD-10-CM

## 2019-01-22 MED ORDER — MIRTAZAPINE 15 MG PO TABS: 15.0000 mg | ORAL_TABLET | Freq: Every day | ORAL | 0 refills | Status: AC

## 2019-01-22 MED ORDER — OLANZAPINE 10 MG PO TABS: 10.0000 mg | ORAL_TABLET | Freq: Every day | ORAL | 0 refills | Status: AC

## 2019-01-22 MED ORDER — GABAPENTIN 600 MG PO TABS: 300.0000 mg | ORAL_TABLET | Freq: Two times a day (BID) | ORAL | 0 refills | Status: AC

## 2019-01-22 MED ORDER — LEVETIRACETAM 500 MG PO TABS: 500.0000 mg | ORAL_TABLET | Freq: Two times a day (BID) | ORAL | 0 refills | Status: AC

## 2019-01-22 MED ORDER — FLUOXETINE HCL 40 MG PO CAPS: 40.0000 mg | ORAL_CAPSULE | Freq: Every day | ORAL | 0 refills | Status: AC

## 2019-01-22 NOTE — Tx Team (Signed)
Interdisciplinary Treatment and Diagnostic Plan Update  01/22/2019 Time of Session: 2:30PM Natalie Pham MRN: [id number]  Principal Diagnosis: Major depressive disorder, recurrent severe without psychotic features ([place])  Secondary Diagnoses: Principal Problem:   Major depressive disorder, recurrent severe without psychotic features ([place]) Active Problems:   Borderline personality disorder ([place])   Self-inflicted laceration of wrist   Current Medications:  Current Facility-Administered Medications  Medication Dose Route Frequency Provider Last Rate Last Dose  . acetaminophen (TYLENOL) tablet 650 mg  650 mg Oral Q6H PRN Patrecia Pour, NP      . alum & mag hydroxide-simeth (MAALOX/MYLANTA) 200-200-20 MG/5ML suspension 30 mL  30 mL Oral Q4H PRN Patrecia Pour, NP      . FLUoxetine (PROZAC) capsule 40 mg  40 mg Oral Daily Patrecia Pour, NP   40 mg at 01/22/19 0818  . gabapentin (NEURONTIN) tablet 300 mg  300 mg Oral BID Johnn Hai, MD   300 mg at 01/22/19 0818  . hydrOXYzine (ATARAX/VISTARIL) tablet 50 mg  50 mg Oral Q6H PRN Johnn Hai, MD   50 mg at 01/21/19 1754  . levETIRAcetam (KEPPRA) tablet 500 mg  500 mg Oral BID Patrecia Pour, NP   500 mg at 01/22/19 0818  . magnesium hydroxide (MILK OF MAGNESIA) suspension 30 mL  30 mL Oral Daily PRN Patrecia Pour, NP      . mirtazapine (REMERON) tablet 15 mg  15 mg Oral QHS Patrecia Pour, NP   15 mg at 01/21/19 2142  . OLANZapine (ZYPREXA) tablet 10 mg  10 mg Oral QHS Johnn Hai, MD   10 mg at 01/21/19 2142   PTA Medications: Medications Prior to Admission  Medication Sig Dispense Refill Last Dose  . OLANZapine (ZYPREXA) 5 MG tablet Take 5 mg by mouth at bedtime.     . [DISCONTINUED] FLUoxetine (PROZAC) 20 MG capsule Take 40 mg by mouth daily.     . [DISCONTINUED] levETIRAcetam (KEPPRA) 500 MG tablet Take 1 tablet (500 mg total) by mouth 2 (two) times daily. 60 tablet 2   . [DISCONTINUED] mirtazapine (REMERON) 15 MG tablet Take 15  mg by mouth at bedtime.       Patient Stressors: Marital or family conflict  Patient Strengths: Average or above average intelligence General fund of knowledge Motivation for treatment/growth  Treatment Modalities: Medication Management, Group therapy, Case management,  1 to 1 session with clinician, Psychoeducation, Recreational therapy.   Physician Treatment Plan for Primary Diagnosis: Major depressive disorder, recurrent severe without psychotic features ([place]) Long Term Goal(s): Improvement in symptoms so as ready for discharge Improvement in symptoms so as ready for discharge   Short Term Goals: Ability to demonstrate self-control will improve Ability to identify and develop effective coping behaviors will improve Ability to maintain clinical measurements within normal limits will improve Compliance with prescribed medications will improve Ability to demonstrate self-control will improve Ability to identify and develop effective coping behaviors will improve Ability to maintain clinical measurements within normal limits will improve Compliance with prescribed medications will improve  Medication Management: Evaluate patient's response, side effects, and tolerance of medication regimen.  Therapeutic Interventions: 1 to 1 sessions, Unit Group sessions and Medication administration.  Evaluation of Outcomes: Adequate for Discharge  Physician Treatment Plan for Secondary Diagnosis: Principal Problem:   Major depressive disorder, recurrent severe without psychotic features ([place]) Active Problems:   Borderline personality disorder ([place])   Self-inflicted laceration of wrist  Long Term Goal(s): Improvement in symptoms so as  ready for discharge Improvement in symptoms so as ready for discharge   Short Term Goals: Ability to demonstrate self-control will improve Ability to identify and develop effective coping behaviors will improve Ability to maintain clinical measurements within  normal limits will improve Compliance with prescribed medications will improve Ability to demonstrate self-control will improve Ability to identify and develop effective coping behaviors will improve Ability to maintain clinical measurements within normal limits will improve Compliance with prescribed medications will improve     Medication Management: Evaluate patient's response, side effects, and tolerance of medication regimen.  Therapeutic Interventions: 1 to 1 sessions, Unit Group sessions and Medication administration.  Evaluation of Outcomes: Adequate for Discharge   RN Treatment Plan for Primary Diagnosis: Major depressive disorder, recurrent severe without psychotic features ([facility]) Long Term Goal(s): Knowledge of disease and therapeutic regimen to maintain health will improve  Short Term Goals: Ability to remain free from injury will improve, Ability to verbalize frustration and anger appropriately will improve, Ability to demonstrate self-control, Ability to participate in decision making will improve and Ability to verbalize feelings will improve  Medication Management: RN will administer medications as ordered by provider, will assess and evaluate patient's response and provide education to patient for prescribed medication. RN will report any adverse and/or side effects to prescribing provider.  Therapeutic Interventions: 1 on 1 counseling sessions, Psychoeducation, Medication administration, Evaluate responses to treatment, Monitor vital signs and CBGs as ordered, Perform/monitor CIWA, COWS, AIMS and Fall Risk screenings as ordered, Perform wound care treatments as ordered.  Evaluation of Outcomes: Adequate for Discharge   LCSW Treatment Plan for Primary Diagnosis: Major depressive disorder, recurrent severe without psychotic features ([facility]) Long Term Goal(s): Safe transition to appropriate next level of care at discharge, Engage patient in therapeutic group addressing  interpersonal concerns.  Short Term Goals: Engage patient in aftercare planning with referrals and resources, Increase social support, Increase ability to appropriately verbalize feelings, Increase emotional regulation and Facilitate acceptance of mental health diagnosis and concerns  Therapeutic Interventions: Assess for all discharge needs, 1 to 1 time with Social worker, Explore available resources and support systems, Assess for adequacy in community support network, Educate family and significant other(s) on suicide prevention, Complete Psychosocial Assessment, Interpersonal group therapy.  Evaluation of Outcomes: Adequate for Discharge   Progress in Treatment: Attending groups: Yes. Participating in groups: Yes. Taking medication as prescribed: Yes. Toleration medication: Yes. Family/Significant other contact made: Yes, individual(s) contacted:  SPE completed with mother. Patient understands diagnosis: Yes. Discussing patient identified problems/goals with staff: Yes. Medical problems stabilized or resolved: Yes. Denies suicidal/homicidal ideation: Yes. Issues/concerns per patient self-inventory: No. Other: none  New problem(s) identified: No, Describe:  none  New Short Term/Long Term Goal(s): medication management for mood stabilization; elimination of SI thoughts; development of comprehensive mental wellness plan.  Patient Goals:  "adjust my medications"  Discharge Plan or Barriers: Patient has an aftercare appointment scheduled with her current mental health provider.  She reports that she will return to her parents home.    Reason for Continuation of Hospitalization: Anxiety Depression Medication stabilization Suicidal ideation  Estimated Length of Stay: 1-5 days  Attendees: Patient: Natalie Pham 01/22/2019 2:54 PM  Physician: Dr. Toni Amendlapacs, MD 01/22/2019 2:54 PM  Nursing: Lars Mageyawn Robotham, RN 01/22/2019 2:54 PM  RN Care Manager: 01/22/2019 2:54 PM  Social Worker: Penni HomansMichaela  Shadiyah Wernli, LCSW 01/22/2019 2:54 PM  Recreational Therapist: Garret ReddishShay Outlaw, Drue FlirtRS, LRT 01/22/2019 2:54 PM  Other: Lowella Dandyarren Livingston, LCSW 01/22/2019 2:54 PM  Other: Iris Pertlivia Moton, LCSW  01/22/2019 2:54 PM  Other: 01/22/2019 2:54 PM    Scribe for Treatment Team: Harden MoMichaela J Angelino Rumery, LCSW 01/22/2019 2:54 PM

## 2019-01-22 NOTE — Progress Notes (Signed)
Recreation Therapy Notes  Date: 01/22/2019  Time: 9:30 am   Location: Craft room   Behavioral response: N/A   Intervention Topic: Stress  Discussion/Intervention: Patient did not attend group.   Clinical Observations/Feedback:  Patient did not attend group.   Stasha Naraine LRT/CTRS        Toia Micale 01/22/2019 10:27 AM
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
## 2019-01-22 NOTE — Progress Notes (Signed)
  [facility] Adult Case Management Discharge Plan :  Will you be returning to the same living situation after discharge:  Yes,  with parents At discharge, do you have transportation home?: Yes,  parents will pick pt up at 4 Do you have the ability to pay for your medications: Yes,  medicaid  Release of information consent forms completed and in the chart;    Patient to Follow up at: [facility]. Go on 02/02/2019.   Why: Please follow up at [facility] on Friday, February 02, 2019 at 12:20pm. Please bring hospital discharge paperwork with you. Thank you. Contact information: [place] [address] Phone:[phone]          Next level of care provider has access to [place] and Suicide Prevention discussed: Yes,  SPE completed with pts mother  Have you used any form of tobacco in the last 30 days? (Cigarettes, Smokeless Tobacco, Cigars, and/or Pipes): Yes  Has patient been referred to the Quitline?: Patient refused referral  Patient has been referred for addiction treatment: [place], LCSW 01/22/2019, 2:52 PM

## 2019-01-22 NOTE — Discharge Summary (Signed)
Physician Discharge Summary Note  Patient:  Natalie Pham is an 18 y.o., female MRN:  [id number] DOB:  Jun 02, 2001 Patient phone:  [phone] (home)  Patient address:   [place] [address],  Total Time spent with patient: 45 minutes  Date of Admission:  01/20/2019 Date of Discharge: January 22, 2019  Reason for Admission: Admitted through the emergency room where she presented with acute self-inflicted injury of her forearm related to emotional stress in the context of ongoing anxiety and depression  Principal Problem: Major depressive disorder, recurrent severe without psychotic features [facility]) Discharge Diagnoses: Principal Problem:   Major depressive disorder, recurrent severe without psychotic features ([place]) Active Problems:   Borderline personality disorder ([place])   Self-inflicted laceration of wrist   Past Psychiatric History: Patient has a history of anxiety and depression with some borderline features.  Has had 3 hospitalizations recently.  Past Medical History: History reviewed. No pertinent past medical history.  Past Surgical History:  Procedure Laterality Date  . APPENDECTOMY     Family History: History reviewed. No pertinent family history. Family Psychiatric  History: See previous Social History:  Social History   Substance and Sexual Activity  Alcohol Use Never  . Frequency: Never     Social History   Substance and Sexual Activity  Drug Use Never    Social History   Socioeconomic History  . Marital status: Soil scientist    Spouse name: Not on file  . Number of children: Not on file  . Years of education: Not on file  . Highest education level: Not on file  Occupational History  . Not on file  Social Needs  . Financial resource strain: Not on file  . Food insecurity    Worry: Not on file    Inability: Not on file  . Transportation needs    Medical: Not on file    Non-medical: Not on file  Tobacco Use  . Smoking status:  Current Every Day Smoker  . Smokeless tobacco: Never Used  Substance and Sexual Activity  . Alcohol use: Never    Frequency: Never  . Drug use: Never  . Sexual activity: Not on file  Lifestyle  . Physical activity    Days per week: Not on file    Minutes per session: Not on file  . Stress: Not on file  Relationships  . Social Herbalist on phone: Not on file    Gets together: Not on file    Attends religious service: Not on file    Active member of club or organization: Not on file    Attends meetings of clubs or organizations: Not on file    Relationship status: Not on file  Other Topics Concern  . Not on file  Social History Narrative  . Not on file    Hospital Course: Patient admitted to psychiatric ward.  15-minute checks.  Continued outpatient medication with some adjustment by increasing olanzapine dose and restarting gabapentin.  Patient has been calm and cooperative throughout her time in the hospital.  Has not shown any dangerous behavior or voiced any suicidal ideation.  She shows good insight and is able to articulate a clear outpatient plan with her psychiatrist and therapist.  Tolerating medicine well.  At the time of discharge no sign of any psychosis.  Denies suicidal thoughts.  Able to articulate a plan to stay away from her major stressor in the future.  Patient can be discharged today no  longer meeting commitment criteria.  Physical Findings: AIMS: Facial and Oral Movements Muscles of Facial Expression: None, normal Lips and Perioral Area: None, normal Jaw: None, normal Tongue: None, normal,Extremity Movements Upper (arms, wrists, hands, fingers): None, normal Lower (legs, knees, ankles, toes): None, normal, Trunk Movements Neck, shoulders, hips: None, normal, Overall Severity Severity of abnormal movements (highest score from questions above): None, normal Incapacitation due to abnormal movements: None, normal Patient's awareness of abnormal  movements (rate only patient's report): No Awareness, Dental Status Current problems with teeth and/or dentures?: No Does patient usually wear dentures?: No  CIWA:    COWS:     Musculoskeletal: Strength & Muscle Tone: within normal limits Gait & Station: normal Patient leans: N/A  Psychiatric Specialty Exam: Physical Exam  Nursing note and vitals reviewed. Constitutional: She appears well-developed and well-nourished.  HENT:  Head: Normocephalic and atraumatic.  Eyes: Pupils are equal, round, and reactive to light. Conjunctivae are normal.  Neck: Normal range of motion.  Cardiovascular: Regular rhythm and normal heart sounds.  Respiratory: Effort normal. No respiratory distress.  GI: Soft.  Musculoskeletal: Normal range of motion.  Neurological: She is alert.  Skin: Skin is warm and dry.     Psychiatric: She has a normal mood and affect. Her speech is normal and behavior is normal. Judgment and thought content normal. Cognition and memory are normal.    Review of Systems  Constitutional: Negative.   HENT: Negative.   Eyes: Negative.   Respiratory: Negative.   Cardiovascular: Negative.   Gastrointestinal: Negative.   Musculoskeletal: Negative.   Skin: Negative.   Neurological: Negative.   Psychiatric/Behavioral: Negative.     Blood pressure 111/76, pulse 76, temperature 97.6 F (36.4 C), temperature source Oral, resp. rate 18, height 5\' 2"  (1.575 m), weight 102.1 kg, SpO2 100 %.Body mass index is 41.15 kg/m.  General Appearance: Casual  Eye Contact:  Good  Speech:  Clear and Coherent  Volume:  Normal  Mood:  Euthymic  Affect:  Congruent  Thought Process:  Goal Directed  Orientation:  Full (Time, Place, and Person)  Thought Content:  Logical  Suicidal Thoughts:  No  Homicidal Thoughts:  No  Memory:  Immediate;   Fair Recent;   Fair Remote;   Fair  Judgement:  Fair  Insight:  Fair  Psychomotor Activity:  Normal  Concentration:  Concentration: Fair  Recall:   FiservFair  Fund of Knowledge:  Fair  Language:  Fair  Akathisia:  No  Handed:  Right  AIMS (if indicated):     Assets:  Desire for Improvement Housing Physical Health Social Support  ADL's:  Intact  Cognition:  WNL  Sleep:  Number of Hours: 8     Have you used any form of tobacco in the last 30 days? (Cigarettes, Smokeless Tobacco, Cigars, and/or Pipes): Yes  Has this patient used any form of tobacco in the last 30 days? (Cigarettes, Smokeless Tobacco, Cigars, and/or Pipes) Yes, Yes, A prescription for an FDA-approved tobacco cessation medication was offered at discharge and the patient refused  Blood Alcohol level:  Lab Results  Component Value Date   ETH <10 01/18/2019   ETH <10 12/30/2018    Metabolic Disorder Labs:  No results found for: HGBA1C, MPG No results found for: PROLACTIN No results found for: CHOL, TRIG, HDL, CHOLHDL, VLDL, LDLCALC  See Psychiatric Specialty Exam and Suicide Risk Assessment completed by Attending Physician prior to discharge.  Discharge destination:  Home  Is patient on multiple antipsychotic therapies at discharge:  No   Has Patient had three or more failed trials of antipsychotic monotherapy by history:  No  Recommended Plan for Multiple Antipsychotic Therapies: NA  Discharge Instructions    Diet - low sodium heart healthy   Complete by: As directed    Increase activity slowly   Complete by: As directed      Allergies as of 01/22/2019      Reactions   Aripiprazole Rash, Hives   Cod [fish Allergy] Other (See Comments)   Tongue Swelling   Lamotrigine    Hallucinations       Medication List    TAKE these medications     Indication  FLUoxetine 40 MG capsule Commonly known as: PROZAC Take 1 capsule (40 mg total) by mouth daily. What changed: medication strength  Indication: Depression   gabapentin 600 MG tablet Commonly known as: NEURONTIN Take 0.5 tablets (300 mg total) by mouth 2 (two) times daily.  Indication: Fibromyalgia  Syndrome   levETIRAcetam 500 MG tablet Commonly known as: KEPPRA Take 1 tablet (500 mg total) by mouth 2 (two) times daily.  Indication: Partial Onset Seizure   mirtazapine 15 MG tablet Commonly known as: REMERON Take 1 tablet (15 mg total) by mouth at bedtime.  Indication: Major Depressive Disorder   OLANZapine 10 MG tablet Commonly known as: ZYPREXA Take 1 tablet (10 mg total) by mouth at bedtime. What changed:   medication strength  how much to take  Indication: Major Depressive Disorder      [facility]   [facility]20pm. Please bring hospital discharge paperwork with you. Thank you. Contact information: [address] [place], [place] [address] [phone] Fax:[phone]          Follow-up recommendations:  Activity:  Activity as tolerated Diet:  Regular diet Other:  Follow-up with outpatient care as directed continue current medicine  Comments: Prescriptions provided.  Case reviewed with treatment team.  Signed: Mordecai RasmussenJohn Clapacs, MD 01/22/2019, 2:14 PM

## 2019-01-22 NOTE — BHH Group Notes (Signed)
LCSW Group Therapy Note   01/22/2019 11:35 AM   Type of Therapy and Topic:  Group Therapy:  Overcoming Obstacles   Participation Level:  Minimal   Description of Group:    In this group patients will be encouraged to explore what they see as obstacles to their own wellness and recovery. They will be guided to discuss their thoughts, feelings, and behaviors related to these obstacles. The group will process together ways to cope with barriers, with attention given to specific choices patients can make. Each patient will be challenged to identify changes they are motivated to make in order to overcome their obstacles. This group will be process-oriented, with patients participating in exploration of their own experiences as well as giving and receiving support and challenge from other group members.   Therapeutic Goals: 1. Patient will identify personal and current obstacles as they relate to admission. 2. Patient will identify barriers that currently interfere with their wellness or overcoming obstacles.  3. Patient will identify feelings, thought process and behaviors related to these barriers. 4. Patient will identify two changes they are willing to make to overcome these obstacles:      Summary of Patient Progress Pt was present and respectful to other group members. Pt reported that she is currently going through a divorce and that is an obstacle for her. Pt reported that she has been feeling really overwhelmed with the divorce. Pt did not really engage in much of the group discussion after identifying her divorce as an obstacle.     Therapeutic Modalities:   Cognitive Behavioral Therapy Solution Focused Therapy Motivational Interviewing Relapse Prevention Therapy  Evalina Field, MSW, LCSW Clinical Social Work 01/22/2019 11:35 AM

## 2019-01-22 NOTE — Progress Notes (Signed)
Patient ID: Natalie Pham, female   DOB: Jun 14, 2001, 18 y.o.   MRN: [id number]   Discharge Note:  Patient denies SI/HI/AVH at this time. Discharge instructions, AVS, prescriptions, and transition record gone over with patient. Patient agrees to comply with medication management, follow-up visit, and outpatient therapy. Patient belongings returned to patient. Patient questions and concerns addressed and answered. Patient ambulatory off unit. Patient discharged to home with mother.

## 2019-01-22 NOTE — Plan of Care (Signed)
D- Patient alert and oriented. Patient presents in a pleasant mood on assessment stating that she slept "alright" last night and had no major concerns to voice. Patient denies any signs/symptoms of depression, however, she rated her anxiety a "5/10", but she can not pinpoint what is making her anxious. Patient denies SI, HI, AVH, and pain at this time. Patient had no stated goals for today.  A- Scheduled medications administered to patient, per MD orders. Support and encouragement provided.  Routine safety checks conducted every 15 minutes.  Patient informed to notify staff with problems or concerns.  R- No adverse drug reactions noted. Patient contracts for safety at this time. Patient compliant with medications and treatment plan. Patient receptive, calm, and cooperative. Patient interacts well with others on the unit.  Patient remains safe at this time.   Problem: Education: Goal: Knowledge of [place] [facility] Education information/materials will improve Outcome: Progressing Goal: Emotional status will improve Outcome: Progressing Goal: Mental status will improve Outcome: Progressing Goal: Verbalization of understanding the information provided will improve Outcome: Progressing   Problem: Activity: Goal: Interest or engagement in activities will improve Outcome: Progressing Goal: Sleeping patterns will improve Outcome: Progressing   Problem: Coping: Goal: Ability to verbalize frustrations and anger appropriately will improve Outcome: Progressing Goal: Ability to demonstrate self-control will improve Outcome: Progressing   Problem: Health Behavior/Discharge Planning: Goal: Identification of resources available to assist in meeting health care needs will improve Outcome: Progressing Goal: Compliance with treatment plan for underlying cause of condition will improve Outcome: Progressing   Problem: Physical Regulation: Goal: Ability to maintain clinical measurements within normal  limits will improve Outcome: Progressing   Problem: Safety: Goal: Periods of time without injury will increase Outcome: Progressing   Problem: Education: Goal: Utilization of techniques to improve thought processes will improve Outcome: Progressing Goal: Knowledge of the prescribed therapeutic regimen will improve Outcome: Progressing   Problem: Activity: Goal: Interest or engagement in leisure activities will improve Outcome: Progressing Goal: Imbalance in normal sleep/wake cycle will improve Outcome: Progressing   Problem: Coping: Goal: Coping ability will improve Outcome: Progressing Goal: Will verbalize feelings Outcome: Progressing   Problem: Health Behavior/Discharge Planning: Goal: Ability to make decisions will improve Outcome: Progressing Goal: Compliance with therapeutic regimen will improve Outcome: Progressing   Problem: Role Relationship: Goal: Will demonstrate positive changes in social behaviors and relationships Outcome: Progressing   Problem: Safety: Goal: Ability to disclose and discuss suicidal ideas will improve Outcome: Progressing Goal: Ability to identify and utilize support systems that promote safety will improve Outcome: Progressing   Problem: Self-Concept: Goal: Will verbalize positive feelings about self Outcome: Progressing Goal: Level of anxiety will decrease Outcome: Progressing

## 2019-01-22 NOTE — [facility] Suicide Risk Assessment (Signed)
[facility] Discharge Suicide Risk Assessment   Principal Problem: <principal problem not specified> Discharge Diagnoses: Active Problems:   Major depressive disorder, recurrent severe without psychotic features ([place])   Total Time spent with patient: 45 minutes  Musculoskeletal: Strength & Muscle Tone: within normal limits Gait & Station: normal Patient leans: N/A  Psychiatric Specialty Exam: Review of Systems  Constitutional: Negative.   HENT: Negative.   Eyes: Negative.   Respiratory: Negative.   Cardiovascular: Negative.   Gastrointestinal: Negative.   Musculoskeletal: Negative.   Skin: Negative.   Neurological: Negative.   Psychiatric/Behavioral: Negative.     Blood pressure 111/76, pulse 76, temperature 97.6 F (36.4 C), temperature source Oral, resp. rate 18, height 5\' 2"  (1.575 m), weight 102.1 kg, SpO2 100 %.Body mass index is 41.15 kg/m.  General Appearance: Casual  Eye Contact::  Good  Speech:  Clear and [id number]  Volume:  Normal  Mood:  Euthymic  Affect:  Congruent  Thought Process:  Goal Directed  Orientation:  Full (Time, Place, and Person)  Thought Content:  Logical  Suicidal Thoughts:  No  Homicidal Thoughts:  No  Memory:  Immediate;   Fair Recent;   Fair Remote;   Fair  Judgement:  Fair  Insight:  Fair  Psychomotor Activity:  Normal  Concentration:  Fair  Recall:  [employer] of Knowledge:Fair  Language: Fair  Akathisia:  No  Handed:  Right  AIMS (if indicated):     Assets:  Desire for Improvement Housing Physical Health  Sleep:  Number of Hours: 8  Cognition: WNL  ADL's:  Intact   Mental Status Per Nursing Assessment::   On Admission:  Suicidal ideation indicated by patient, Self-harm thoughts, Self-harm behaviors, Suicidal ideation indicated by others  Demographic Factors:  Adolescent or young adult, Caucasian, Abner Greenspan, lesbian, or bisexual orientation and Unemployed  Loss Factors: Loss of significant relationship  Historical  Factors: Impulsivity  Risk Reduction Factors:   Sense of responsibility to family, Positive social support and Positive therapeutic relationship  Continued Clinical Symptoms:  Depression:   Impulsivity  Cognitive Features That Contribute To Risk:  None    Suicide Risk:  Minimal: No identifiable suicidal ideation.  Patients presenting with no risk factors but with morbid ruminations; may be classified as minimal risk based on the severity of the depressive symptoms  Follow-up [facility]. Go on 02/02/2019.   Why: Please follow up at [facility] on Friday, February 02, 2019 at 12:20pm. Please bring hospital discharge paperwork with you. Thank you. Contact information: [address] [place] [address] JYNWG:[phone]          Plan Of Care/Follow-up recommendations:  Activity:  Activity as tolerated Diet:  Regular diet Other:  Patient has a psychiatrist and therapist both in the [place] area with whom she will follow-up and has appointments already scheduled.  We discussed ways for her to limit major stresses to avoid these kind of impulsive behaviors again.  Alethia Berthold, MD 01/22/2019, 2:06 PM

## 2019-02-28 ENCOUNTER — Emergency Department
Admission: EM | Admit: 2019-02-28 | Discharge: 2019-02-28 | Payer: Medicaid - Out of State | Attending: Emergency Medicine | Admitting: Emergency Medicine

## 2019-02-28 ENCOUNTER — Other Ambulatory Visit: Payer: Self-pay

## 2019-02-28 ENCOUNTER — Encounter: Payer: Self-pay | Admitting: Emergency Medicine

## 2019-02-28 DIAGNOSIS — T50902A Poisoning by unspecified drugs, medicaments and biological substances, intentional self-harm, initial encounter: Secondary | ICD-10-CM | POA: Diagnosis present

## 2019-02-28 DIAGNOSIS — T450X2S Poisoning by antiallergic and antiemetic drugs, intentional self-harm, sequela: Secondary | ICD-10-CM

## 2019-02-28 DIAGNOSIS — T43592A Poisoning by other antipsychotics and neuroleptics, intentional self-harm, initial encounter: Secondary | ICD-10-CM | POA: Diagnosis not present

## 2019-02-28 DIAGNOSIS — F17210 Nicotine dependence, cigarettes, uncomplicated: Secondary | ICD-10-CM | POA: Insufficient documentation

## 2019-02-28 DIAGNOSIS — S61519A Laceration without foreign body of unspecified wrist, initial encounter: Secondary | ICD-10-CM | POA: Diagnosis present

## 2019-02-28 DIAGNOSIS — F603 Borderline personality disorder: Secondary | ICD-10-CM | POA: Diagnosis not present

## 2019-02-28 DIAGNOSIS — F332 Major depressive disorder, recurrent severe without psychotic features: Secondary | ICD-10-CM | POA: Diagnosis not present

## 2019-02-28 DIAGNOSIS — Z79899 Other long term (current) drug therapy: Secondary | ICD-10-CM | POA: Insufficient documentation

## 2019-02-28 DIAGNOSIS — X789XXA Intentional self-harm by unspecified sharp object, initial encounter: Secondary | ICD-10-CM | POA: Diagnosis present

## 2019-02-28 LAB — CBC WITH DIFFERENTIAL/PLATELET
Abs Immature Granulocytes: 0.08 10*3/uL — ABNORMAL HIGH (ref 0.00–0.07)
Basophils Absolute: 0 10*3/uL (ref 0.0–0.1)
Basophils Relative: 0 %
Eosinophils Absolute: 0.1 10*3/uL (ref 0.0–0.5)
Eosinophils Relative: 1 %
HCT: 36.8 % (ref 36.0–46.0)
Hemoglobin: 12.3 g/dL (ref 12.0–15.0)
Immature Granulocytes: 1 %
Lymphocytes Relative: 17 %
Lymphs Abs: 1.5 10*3/uL (ref 0.7–4.0)
MCH: 31 pg (ref 26.0–34.0)
MCHC: 33.4 g/dL (ref 30.0–36.0)
MCV: 92.7 fL (ref 80.0–100.0)
Monocytes Absolute: 0.6 10*3/uL (ref 0.1–1.0)
Monocytes Relative: 7 %
Neutro Abs: 6.4 10*3/uL (ref 1.7–7.7)
Neutrophils Relative %: 74 %
Platelets: 187 10*3/uL (ref 150–400)
RBC: 3.97 MIL/uL (ref 3.87–5.11)
RDW: 12.8 % (ref 11.5–15.5)
WBC: 8.6 10*3/uL (ref 4.0–10.5)
nRBC: 0 % (ref 0.0–0.2)

## 2019-02-28 LAB — COMPREHENSIVE METABOLIC PANEL
ALT: 15 U/L (ref 0–44)
AST: 20 U/L (ref 15–41)
Albumin: 4.2 g/dL (ref 3.5–5.0)
Alkaline Phosphatase: 89 U/L (ref 38–126)
Anion gap: 12 (ref 5–15)
BUN: 15 mg/dL (ref 6–20)
CO2: 21 mmol/L — ABNORMAL LOW (ref 22–32)
Calcium: 9.5 mg/dL (ref 8.9–10.3)
Chloride: 103 mmol/L (ref 98–111)
Creatinine, Ser: 0.85 mg/dL (ref 0.44–1.00)
GFR calc Af Amer: >60 mL/min (ref >60)
GFR calc non Af Amer: >60 mL/min (ref >60)
Glucose, Bld: 121 mg/dL — ABNORMAL HIGH (ref 70–99)
Potassium: 3.3 mmol/L — ABNORMAL LOW (ref 3.5–5.1)
Sodium: 136 mmol/L (ref 135–145)
Total Bilirubin: 0.3 mg/dL (ref 0.3–1.2)
Total Protein: 8 g/dL (ref 6.5–8.1)

## 2019-02-28 LAB — URINE DRUG SCREEN, QUALITATIVE (ARMC ONLY)
Amphetamines, Ur Screen: NOT DETECTED
Barbiturates, Ur Screen: NOT DETECTED
Benzodiazepine, Ur Scrn: NOT DETECTED
Cannabinoid 50 Ng, Ur ~~LOC~~: NOT DETECTED
Cocaine Metabolite,Ur ~~LOC~~: NOT DETECTED
MDMA (Ecstasy)Ur Screen: NOT DETECTED
Methadone Scn, Ur: NOT DETECTED
Opiate, Ur Screen: NOT DETECTED
Phencyclidine (PCP) Ur S: NOT DETECTED
Tricyclic, Ur Screen: POSITIVE — AB

## 2019-02-28 LAB — ACETAMINOPHEN LEVEL
Acetaminophen (Tylenol), Serum: <10 ug/mL — ABNORMAL LOW (ref 10–30)
Acetaminophen (Tylenol), Serum: <10 ug/mL — ABNORMAL LOW (ref 10–30)

## 2019-02-28 LAB — URINALYSIS, COMPLETE (UACMP) WITH MICROSCOPIC
Bacteria, UA: NONE SEEN
Bilirubin Urine: NEGATIVE
Glucose, UA: NEGATIVE mg/dL
Hgb urine dipstick: NEGATIVE
Ketones, ur: NEGATIVE mg/dL
Leukocytes,Ua: NEGATIVE
Nitrite: NEGATIVE
Protein, ur: NEGATIVE mg/dL
Specific Gravity, Urine: 1.021 (ref 1.005–1.030)
pH: 6 (ref 5.0–8.0)

## 2019-02-28 LAB — MAGNESIUM: Magnesium: 1.9 mg/dL (ref 1.7–2.4)

## 2019-02-28 LAB — SALICYLATE LEVEL
Salicylate Lvl: <7 mg/dL (ref 2.8–30.0)
Salicylate Lvl: <7 mg/dL (ref 2.8–30.0)

## 2019-02-28 LAB — ETHANOL: Alcohol, Ethyl (B): <10 mg/dL (ref <10)

## 2019-02-28 MED ORDER — SODIUM CHLORIDE 0.9 % IV BOLUS
1000.0000 mL | Freq: Once | INTRAVENOUS | Status: AC
  Administered 2019-02-28: 1000 mL via INTRAVENOUS

## 2019-02-28 NOTE — Discharge Instructions (Signed)
Only take your prescribed medicines as directed by your doctor.  Return to the ER for worsening symptoms, persistent vomiting, difficulty breathing, feelings of hurting yourself or others, or other concerns.

## 2019-02-28 NOTE — ED Notes (Signed)
IVC,  OD

## 2019-02-28 NOTE — ED Triage Notes (Signed)
Patient ambulatory to triage with steady gait, without difficulty or distress noted, mask in place; in custody of [place] [employer]; officer reports d/c from [facility] yesterday, drove self to station to turn self in and when she realized she would be kept, began making statements of SI and that she took 40 seroquel 200mg  at unknown time; here for medical clearance; pt admits to persistent SI

## 2019-02-28 NOTE — ED Notes (Signed)
Pt alert and oriented with steady gait around room.

## 2019-02-28 NOTE — Consult Note (Signed)
[facility] Face-to[facility] Consult   Reason for Consult:  Suicide attempt Referring Physician: Dr. Dolores FrameSung Patient Identification: Natalie RossettiHaley Pham Delehanty MRN:  [id number] Principal Diagnosis: <principal problem not specified> Diagnosis:  Active Problems:   Diphenhydramine overdose, intentional self-harm, sequela (HCC)   Borderline personality disorder (HCC)   Major depressive disorder, recurrent, severe without psychotic features (HCC)   Major depressive disorder, recurrent severe without psychotic features (HCC)   Self-inflicted laceration of wrist   Total Time spent with patient: 1 hour  Subjective: "I took 40 Seroquel 200 mg tablets tonight." Natalie RossettiHaley Pham Blatchley is a 18 y.o. female patient admitted with suicide attempt.  The patient [facility] on Monday [facility]/2019.  [facility] [place] [facility] [place] [facility] [place].  She realized she would be arrested because of a warrant out for her arrest.  She realized of her impending arrest, she bagan voicing to the police officer that she had taken 40 of her 200 mg Seroquel tablets.  She began to exhibit signs and symptoms of the medication reaction and was brought to the ED by the police officer.   This provider reinforced the teaching that was provided to the patient on her last admission about utilizing her coping skills and following up with her outpatient services..  Per Ms. Shaune PollackLord; did not follow up with her outpatient appointments, "the hospitals did nothing for me," asked about utilizing her coping skills. Her assessment the patient voiced "I blame my ex-wife for all of this."  When asked if she wanted to hurt anyone? She voiced "no." but stated "if I did it would be my ex-wife.  She continued to deny as she narrated "if she had not called the police I would not be in this mess."  She continued to state "I know I am not doing the right things in life.  I am only 18 years old and I can afford to make the mistakes I am making. I know I  am still young."  It was discussed with the patient, yes, she is 18 years old but intentionally doing things to harm herself or make life more difficult for her is not a reason to justify her actions.  This continues to show the patient borderline personality traits dominant the assessment, high risk for self harm due to her manipulative behaviors.  HPI:  Patient reports she and her girlfriend have been going through a break-up.  She just removed her girlfriends close from her home became very upset.  She reports that she started cutting lightly into her left forearm.  She was doing this to relieve stress.  Reports she was very upset told the officer at one point if she had pills she would overdose, but reports she is calm and now she is not having any thoughts she wants to hurt herself or anyone else.  Reports she is got very upset that no to handle the situation well.  Past Psychiatric History: Depression Borderline personality  Risk to Self:  yes Risk to Others:  none Prior Inpatient Therapy:  Yes, multiple Prior Outpatient Therapy:  No  Past Medical History: History reviewed. No pertinent past medical history.  Past Surgical History:  Procedure Laterality Date  . APPENDECTOMY     Family History: No family history on file. Family Psychiatric  History: none Social History:  Social History   Substance and Sexual Activity  Alcohol Use Never  . Frequency: Never     Social History   Substance and  Sexual Activity  Drug Use Never    Social History   Socioeconomic History  . Marital status: Media plannerDomestic Partner    Spouse name: Not on file  . Number of children: Not on file  . Years of education: Not on file  . Highest education level: Not on file  Occupational History  . Not on file  Social Needs  . Financial resource strain: Not on file  . Food insecurity    Worry: Not on file    Inability: Not on file  . Transportation needs    Medical: Not on file    Non-medical: Not on  file  Tobacco Use  . Smoking status: Current Every Day Smoker  . Smokeless tobacco: Never Used  Substance and Sexual Activity  . Alcohol use: Never    Frequency: Never  . Drug use: Never  . Sexual activity: Not on file  Lifestyle  . Physical activity    Days per week: Not on file    Minutes per session: Not on file  . Stress: Not on file  Relationships  . Social Musicianconnections    Talks on phone: Not on file    Gets together: Not on file    Attends religious service: Not on file    Active member of club or organization: Not on file    Attends meetings of clubs or organizations: Not on file    Relationship status: Not on file  Other Topics Concern  . Not on file  Social History Narrative  . Not on file   Additional Social History:    Allergies:   Allergies  Allergen Reactions  . Aripiprazole Rash and Hives  . [facility][Fish Allergy] Other (See Comments)    Tongue Swelling  . Lamotrigine     Hallucinations     Labs:  Results for orders placed or performed during the hospital encounter of 02/28/19 (from the past 48 hour(s))  CBC with Differential     Status: Abnormal   Collection Time: 02/28/19  1:34 AM  Result Value Ref Range   WBC 8.6 4.0 - 10.5 K/uL   RBC 3.97 3.87 - 5.11 MIL/uL   Hemoglobin 12.3 12.0 - 15.0 g/dL   HCT 60.436.8 54.036.0 - 98.146.0 %   MCV 92.7 80.0 - 100.0 fL   MCH 31.0 26.0 - 34.0 pg   MCHC 33.4 30.0 - 36.0 g/dL   RDW 19.112.8 47.811.5 - 29.515.5 %   Platelets 187 150 - 400 K/uL   nRBC 0.0 0.0 - 0.2 %   Neutrophils Relative % 74 %   Neutro Abs 6.4 1.7 - 7.7 K/uL   Lymphocytes Relative 17 %   Lymphs Abs 1.5 0.7 - 4.0 K/uL   Monocytes Relative 7 %   Monocytes Absolute 0.6 0.1 - 1.0 K/uL   Eosinophils Relative 1 %   Eosinophils Absolute 0.1 0.0 - 0.5 K/uL   Basophils Relative 0 %   Basophils Absolute 0.0 0.0 - 0.1 K/uL   Immature Granulocytes 1 %   Abs Immature Granulocytes 0.08 (H) 0.00 - 0.07 K/uL    Comment: Performed at [facility], [address] [place], [place] [address]  Comprehensive metabolic panel     Status: Abnormal   Collection Time: 02/28/19  1:34 AM  Result Value Ref Range   Sodium 136 135 - 145 mmol/L   Potassium 3.3 (L) 3.5 - 5.1 mmol/L   Chloride 103 98 - 111 mmol/L   CO2 21 (L) 22 - 32 mmol/L  Glucose, Bld 121 (H) 70 - 99 mg/dL   BUN 15 6 - 20 mg/dL   Creatinine, Ser 4.090.85 0.44 - 1.00 mg/dL   Calcium 9.5 8.9 - 81.110.3 mg/dL   Total Protein 8.0 6.5 - 8.1 g/dL   Albumin 4.2 3.5 - 5.0 g/dL   AST 20 15 - 41 U/L   ALT 15 0 - 44 U/L   Alkaline Phosphatase 89 38 - 126 U/L   Total Bilirubin 0.3 0.3 - 1.2 mg/dL   GFR calc non Af Amer >60 >60 mL/min   GFR calc Af Amer >60 >60 mL/min   Anion gap 12 5 - 15    Comment: Performed at [facility], [address] [place], [place] [address]  Urine Drug Screen, Qualitative     Status: Abnormal   Collection Time: 02/28/19  1:34 AM  Result Value Ref Range   Tricyclic, Ur Screen POSITIVE (A) NONE DETECTED   Amphetamines, Ur Screen NONE DETECTED NONE DETECTED   MDMA (Ecstasy)Ur Screen NONE DETECTED NONE DETECTED   Cocaine Metabolite,Ur [place] NONE DETECTED NONE DETECTED   Opiate, Ur Screen NONE DETECTED NONE DETECTED   Phencyclidine (PCP) Ur S NONE DETECTED NONE DETECTED   Cannabinoid 50 Ng, Ur [place] NONE DETECTED NONE DETECTED   Barbiturates, Ur Screen NONE DETECTED NONE DETECTED   Benzodiazepine, Ur Scrn NONE DETECTED NONE DETECTED   Methadone Scn, Ur NONE DETECTED NONE DETECTED    Comment: (NOTE) Tricyclics + metabolites, urine    Cutoff 1000 ng/mL Amphetamines + metabolites, urine  Cutoff 1000 ng/mL MDMA (Ecstasy), urine              Cutoff 500 ng/mL Cocaine Metabolite, urine          Cutoff 300 ng/mL Opiate + metabolites, urine        Cutoff 300 ng/mL Phencyclidine (PCP), urine         Cutoff 25 ng/mL Cannabinoid, urine                 Cutoff 50 ng/mL Barbiturates + metabolites, urine  Cutoff 200 ng/mL Benzodiazepine, urine              Cutoff 200 ng/mL Methadone,  urine                   Cutoff 300 ng/mL The urine drug screen provides only a preliminary, unconfirmed analytical test result and should not be used for non-medical purposes. Clinical consideration and professional judgment should be applied to any positive drug screen result due to possible interfering substances. A more specific alternate chemical method must be used in order to obtain a confirmed analytical result. Gas chromatography / mass spectrometry (GC/MS) is the preferred confirmat ory method. Performed at [facility], [address] [place] [address] [place], [place] [address]   Urinalysis, Complete w Microscopic     Status: Abnormal   Collection Time: 02/28/19  1:34 AM  Result Value Ref Range   Color, Urine YELLOW (A) YELLOW   APPearance HAZY (A) CLEAR   Specific Gravity, Urine 1.021 1.005 - 1.030   pH 6.0 5.0 - 8.0   Glucose, UA NEGATIVE NEGATIVE mg/dL   Hgb urine dipstick NEGATIVE NEGATIVE   Bilirubin Urine NEGATIVE NEGATIVE   Ketones, ur NEGATIVE NEGATIVE mg/dL   Protein, ur NEGATIVE NEGATIVE mg/dL   Nitrite NEGATIVE NEGATIVE   Leukocytes,Ua NEGATIVE NEGATIVE   RBC / HPF 6-10 0 - 5 RBC/hpf   WBC, UA 0-5 0 - 5 WBC/hpf   Bacteria,  UA NONE SEEN NONE SEEN   Squamous Epithelial / LPF 0-5 0 - 5   Mucus PRESENT    Budding Yeast PRESENT     Comment: Performed at [facility], [address] [place], [place] [address]  Magnesium     Status: None   Collection Time: 02/28/19  1:34 AM  Result Value Ref Range   Magnesium 1.9 1.7 - 2.4 mg/dL    Comment: Performed at [facility], [address] [place], [place] [address]  Acetaminophen level     Status: Abnormal   Collection Time: 02/28/19  1:58 AM  Result Value Ref Range   Acetaminophen (Tylenol), Serum <10 (L) 10 - 30 ug/mL    Comment: (NOTE) Therapeutic concentrations vary significantly. A range of 10-30 ug/mL  may be an effective concentration for many patients. However, some  are best treated at  concentrations outside of this range. Acetaminophen concentrations >150 ug/mL at 4 hours after ingestion  and >50 ug/mL at 12 hours after ingestion are often associated with  toxic reactions. Performed at [facility], [address] [place], [place] [address]   Ethanol     Status: None   Collection Time: 02/28/19  1:58 AM  Result Value Ref Range   Alcohol, Ethyl (B) <10 <10 mg/dL    Comment: (NOTE) Lowest detectable limit for serum alcohol is 10 mg/dL. For medical purposes only. Performed at [facility], [address] [place], [place] [address]   Salicylate level     Status: None   Collection Time: 02/28/19  1:58 AM  Result Value Ref Range   Salicylate Lvl <2.9 2.8 - 30.0 mg/dL    Comment: Performed at [facility], [address] [place], [place] [address]    No current facility-administered medications for this encounter.    Current Outpatient Medications  Medication Sig Dispense Refill  . FLUoxetine (PROZAC) 40 MG capsule Take 1 capsule (40 mg total) by mouth daily. 30 capsule 0  . gabapentin (NEURONTIN) 600 MG tablet Take 0.5 tablets (300 mg total) by mouth 2 (two) times daily. 60 tablet 0  . levETIRAcetam (KEPPRA) 500 MG tablet Take 1 tablet (500 mg total) by mouth 2 (two) times daily. 60 tablet 0  . mirtazapine (REMERON) 15 MG tablet Take 1 tablet (15 mg total) by mouth at bedtime. 30 tablet 0  . OLANZapine (ZYPREXA) 10 MG tablet Take 1 tablet (10 mg total) by mouth at bedtime. 30 tablet 0    Musculoskeletal: Strength & Muscle Tone: within normal limits Gait & Station: normal Patient leans: Pham/A  Psychiatric Specialty Exam: Physical Exam  Nursing note and vitals reviewed. Constitutional: She is oriented to person, place, and time. She appears well-developed and well-nourished.  HENT:  Head: Normocephalic.  Neck: Normal range of motion.  Respiratory: Effort normal.  Musculoskeletal: Normal range of motion.  Neurological: She is  alert and oriented to person, place, and time.  Psychiatric: Her speech is normal and behavior is normal. Her mood appears anxious. Her affect is blunt. Cognition and memory are normal. She expresses impulsivity. She exhibits a depressed mood. She expresses suicidal ideation. She expresses suicidal plans.    Review of Systems  Psychiatric/Behavioral: Positive for depression and suicidal ideas. The patient is nervous/anxious.   All other systems reviewed and are negative.   Blood pressure (!) 119/59, pulse 88, temperature 97.7 F (36.5 C), temperature source Oral, resp. rate 12, height 5\' 3"  (1.6 m), weight 108.9 kg, last menstrual period 02/21/2019,  SpO2 96 %.Body mass index is 42.51 kg/m.  General Appearance: casual  Eye Contact:  Fair  Speech:  Clear and Coherent  Volume:  Decreased  Mood:  Anxious and Depressed  Affect:  Blunt  Thought Process:  Coherent  Orientation:  Full (Time, Place, and Person)  Thought Content:  WDL and Logical  Suicidal Thoughts:  Yes.  with intent/plan  Homicidal Thoughts:  No  Memory:  Immediate;   Fair Recent;   Fair Remote;   Fair  Judgement:  Poor  Insight:  Fair  Psychomotor Activity:  Normal  Concentration:  Concentration: Fair and Attention Span: Fair  Recall:  [employer] of Knowledge:  Fair  Language:  Fair  Akathisia:  No  Handed:  Right  AIMS (if indicated):     Assets:  Leisure Time Physical Health Resilience  ADL's:  Intact  Cognition:  WNL  Sleep:   Okay     Treatment Plan Summary: Daily contact with patient to assess and evaluate symptoms and progress in treatment, Medication management and Plan The patient does not meet criteria for psychiatric inpatient admission.    Disposition: Patient does not meet criteria for psychiatric inpatient admission. Once patient is discharged she will be placed in the custody of law enforcement.  Gillermo Murdoch, NP 02/28/2019 5:58 AM

## 2019-02-28 NOTE — ED Provider Notes (Signed)
[facility] [address] Emergency Department Provider Note   ____________________________________________   First MD Initiated Contact with Patient 02/28/19 0102     (approximate)  I have reviewed the triage vital signs and the nursing notes.   HISTORY  Chief Complaint Drug Overdose    HPI Natalie Pham is a 18 y.o. female discharged from [facility] psychiatric unit yesterday.  She drove to the [facility] police station to turn herself in because she has outstanding warrants and then claimed intentional overdose 40 Seroquel 200 mg tablets when she realized she was going to be kept by the police.  Claims unknown time of ingestion.  Police brings her for medical clearance for jail.  Patient reports persistent SI.  Denies fever, cough, chest pain, shortness of breath, abdominal pain, nausea or vomiting.       Past medical history None  Patient Active Problem List   Diagnosis Date Noted  . Self-inflicted laceration of wrist 01/22/2019  . Major depressive disorder, recurrent severe without psychotic features ([place]) 01/20/2019  . Borderline personality disorder ([place]) 01/19/2019  . Major depressive disorder, recurrent, severe without psychotic features ([place]) 01/19/2019  . Diphenhydramine overdose, intentional self-harm, sequela ([place]) 01/01/2019    Past Surgical History:  Procedure Laterality Date  . APPENDECTOMY      Prior to Admission medications   Medication Sig Start Date End Date Taking? Authorizing Provider  FLUoxetine (PROZAC) 40 MG capsule Take 1 capsule (40 mg total) by mouth daily. 01/22/19   Clapacs, Madie Reno, MD  gabapentin (NEURONTIN) 600 MG tablet Take 0.5 tablets (300 mg total) by mouth 2 (two) times daily. 01/22/19   Clapacs, Madie Reno, MD  levETIRAcetam (KEPPRA) 500 MG tablet Take 1 tablet (500 mg total) by mouth 2 (two) times daily. 01/22/19   Clapacs, Madie Reno, MD  mirtazapine (REMERON) 15 MG tablet Take 1 tablet (15 mg total) by mouth at bedtime. 01/22/19   Clapacs,  Madie Reno, MD  OLANZapine (ZYPREXA) 10 MG tablet Take 1 tablet (10 mg total) by mouth at bedtime. 01/22/19   Clapacs, Madie Reno, MD    Allergies Aripiprazole, [facility] allergy], and Lamotrigine  No family history on file.  Social History Social History   Tobacco Use  . Smoking status: Current Every Day Smoker  . Smokeless tobacco: Never Used  Substance Use Topics  . Alcohol use: Never    Frequency: Never  . Drug use: Never    Review of Systems  Constitutional: No fever/chills Eyes: No visual changes. ENT: No sore throat. Cardiovascular: Denies chest pain. Respiratory: Denies shortness of breath. Gastrointestinal: No abdominal pain.  No nausea, no vomiting.  No diarrhea.  No constipation. Genitourinary: Negative for dysuria. Musculoskeletal: Negative for back pain. Skin: Negative for rash. Neurological: Negative for headaches, focal weakness or numbness. Psychiatric:  Positive for depression with SI.   ____________________________________________   PHYSICAL EXAM:  VITAL SIGNS: ED Triage Vitals  Enc Vitals Group     BP 02/28/19 0057 134/66     Pulse Rate 02/28/19 0057 (!) 140     Resp 02/28/19 0057 18     Temp 02/28/19 0057 97.7 F (36.5 C)     Temp Source 02/28/19 0057 Oral     SpO2 02/28/19 0057 96 %     Weight 02/28/19 0055 240 lb (108.9 kg)     Height 02/28/19 0055 5\' 3"  (1.6 m)     Head Circumference --      Peak Flow --      Pain  Score 02/28/19 0057 0     Pain Loc --      Pain Edu? --      Excl. in GC? --     Constitutional: Alert and oriented. Well appearing and in no acute distress. Eyes: Conjunctivae are normal. PERRL. EOMI. Head: Atraumatic. Nose: No congestion/rhinnorhea. Mouth/Throat: Mucous membranes are moist.  Oropharynx non-erythematous. Neck: No stridor.   Cardiovascular: Normal rate, regular rhythm. Grossly normal heart sounds.  Good peripheral circulation. Respiratory: Normal respiratory effort.  No retractions. Lungs CTAB.  Gastrointestinal: Soft and nontender. No distention. No abdominal bruits. No CVA tenderness. Musculoskeletal: No lower extremity tenderness nor edema.  No joint effusions. Neurologic:  Normal speech and language. No gross focal neurologic deficits are appreciated. No gait instability. Skin:  Skin is warm, dry and intact. No rash noted. Psychiatric: Mood and affect are flat. Speech and behavior are normal.  ____________________________________________   LABS (all labs ordered are listed, but only abnormal results are displayed)  Labs Reviewed  CBC WITH DIFFERENTIAL/PLATELET - Abnormal; Notable for the following components:      Result Value   Abs Immature Granulocytes 0.08 (*)    All other components within normal limits  COMPREHENSIVE METABOLIC PANEL - Abnormal; Notable for the following components:   Potassium 3.3 (*)    CO2 21 (*)    Glucose, Bld 121 (*)    All other components within normal limits  URINE DRUG SCREEN, QUALITATIVE ([facility] ONLY) - Abnormal; Notable for the following components:   Tricyclic, Ur Screen POSITIVE (*)    All other components within normal limits  URINALYSIS, COMPLETE (UACMP) WITH MICROSCOPIC - Abnormal; Notable for the following components:   Color, Urine YELLOW (*)    APPearance HAZY (*)    All other components within normal limits  ACETAMINOPHEN LEVEL - Abnormal; Notable for the following components:   Acetaminophen (Tylenol), Serum <10 (*)    All other components within normal limits  MAGNESIUM  ETHANOL  SALICYLATE LEVEL  ACETAMINOPHEN LEVEL  SALICYLATE LEVEL  POC URINE PREG, ED   ____________________________________________  EKG  ED ECG REPORT I, Uriah Philipson J, the attending physician, personally viewed and interpreted this ECG.   Date: 02/28/2019  EKG Time: 0105  Rate: 128  Rhythm: sinus tachycardia  Axis: Normal  Intervals:none  ST&T Change: Nonspecific  ____________________________________________  RADIOLOGY  ED MD  interpretation: None  Official radiology report(s): No results found.  ____________________________________________   PROCEDURES  Procedure(s) performed (including Critical Care):  Procedures   ____________________________________________   INITIAL IMPRESSION / ASSESSMENT AND PLAN / ED COURSE  As part of my medical decision making, I reviewed the following data within the electronic MEDICAL RECORD NUMBER Nursing notes reviewed and incorporated, Labs reviewed, EKG interpreted, Old chart reviewed, A consult was requested and obtained from this/these consultant(s) Psychiatry and Notes from prior ED visits     Natalie Pham was evaluated in Emergency Department on 02/28/2019 for the symptoms described in the history of present illness. She was evaluated in the context of the global COVID-19 pandemic, which necessitated consideration that the patient might be at risk for infection with the SARS-CoV-2 virus that causes COVID-19. Institutional protocols and algorithms that pertain to the evaluation of patients at risk for COVID-19 are in a state of rapid change based on information released by regulatory bodies including the CDC and federal and state organizations. These policies and algorithms were followed during the patient's care in the ED.   18 year old female who claims intentional ingestion of 40  Seroquel 200 mg tablets at an unknown time.  She just states it was sometime last evening.  Will place patient under IVC, consult psychiatry.  Nursing to call poison control for recommendations in addition to supportive care.   Clinical Course as of Feb 27 645  Wed Feb 28, 2019  0152 Nursing spoke with poison control who recommended checking a magnesium level, 4-hour levels of acetaminophen and salicylate.  Otherwise supportive treatment and a minimum 6-hour period of monitoring.   [JS]  0245 Patient has been evaluated by psychiatric NP and deemed psychiatrically stable for discharge home.  Will  monitor patient for 6 hours until medically cleared, then rescind her IVC and discharge with police to jail.   [JS]  [id number] Repeat acetaminophen and salicylate levels unremarkable.  Will rescind patient's IVC and discharge with police.  Strict return precautions given.  Patient verbalizes understanding agrees with plan of care.   [JS]    Clinical Course User Index [JS] Irean HongSung, Yanci Bachtell J, MD     ____________________________________________   FINAL CLINICAL IMPRESSION(S) / ED DIAGNOSES  Final diagnoses:  Intentional drug overdose, initial encounter [facility](HCC)     ED Discharge Orders    None       Note:  This document was prepared using Dragon voice recognition software and may include unintentional dictation errors.   Irean HongSung, Trejan Buda J, MD 02/28/19 [phone]

## 2019-04-02 ENCOUNTER — Other Ambulatory Visit: Payer: Self-pay

## 2019-04-02 ENCOUNTER — Emergency Department (HOSPITAL_COMMUNITY)
Admission: EM | Admit: 2019-04-02 | Discharge: 2019-04-03 | Disposition: A | Discharge status: Home / Self Care | Payer: Medicaid - Out of State | Attending: Emergency Medicine | Admitting: Emergency Medicine

## 2019-04-02 ENCOUNTER — Encounter (HOSPITAL_COMMUNITY): Payer: Self-pay

## 2019-04-02 DIAGNOSIS — G43909 Migraine, unspecified, not intractable, without status migrainosus: Secondary | ICD-10-CM | POA: Insufficient documentation

## 2019-04-02 DIAGNOSIS — F17210 Nicotine dependence, cigarettes, uncomplicated: Secondary | ICD-10-CM | POA: Insufficient documentation

## 2019-04-02 DIAGNOSIS — Z79899 Other long term (current) drug therapy: Secondary | ICD-10-CM | POA: Diagnosis not present

## 2019-04-02 HISTORY — DX: Major depressive disorder, single episode, unspecified: F32.9

## 2019-04-02 HISTORY — DX: Borderline personality disorder: F60.3

## 2019-04-02 HISTORY — DX: Migraine, unspecified, not intractable, without status migrainosus: G43.909

## 2019-04-02 HISTORY — DX: Poisoning by unspecified drugs, medicaments and biological substances, intentional self-harm, initial encounter: T50.902A

## 2019-04-02 NOTE — ED Notes (Signed)
History of migraines, last one 6 months ago. Pt reports was seen in [place] for abd pain in ED. Morphine made her act crazy so they gave her ativan. Pt reports went home and stopped breathing. Pt reports she was then brought back to hospital and intubated for three days. Pt states she has migraines but has never been given prescriptions for them

## 2019-04-02 NOTE — ED Triage Notes (Addendum)
Pt presents to ED with complaints of migraine x 3 days. Pt states she is nauseated and having light sensitivity. Pt says she was just recently released from hospital in [place], seen for abdominal pain, was on ventilator after receiving Morphine 6 mg and Ativan 3 mg.

## 2019-04-03 ENCOUNTER — Emergency Department (HOSPITAL_COMMUNITY): Payer: Medicaid - Out of State

## 2019-04-03 MED ORDER — KETOROLAC TROMETHAMINE 30 MG/ML IJ SOLN
30.0000 mg | Freq: Once | INTRAMUSCULAR | Status: AC
  Administered 2019-04-03: 30 mg via INTRAVENOUS
  Filled 2019-04-03: qty 1

## 2019-04-03 MED ORDER — PROCHLORPERAZINE EDISYLATE 10 MG/2ML IJ SOLN
10.0000 mg | Freq: Once | INTRAMUSCULAR | Status: AC
  Administered 2019-04-03: 10 mg via INTRAVENOUS
  Filled 2019-04-03: qty 2

## 2019-04-03 MED ORDER — DIPHENHYDRAMINE HCL 50 MG/ML IJ SOLN
25.0000 mg | Freq: Once | INTRAMUSCULAR | Status: AC
  Administered 2019-04-03: 01:00:00 25 mg via INTRAVENOUS
  Filled 2019-04-03: qty 1

## 2019-04-03 MED ORDER — DEXAMETHASONE SODIUM PHOSPHATE 10 MG/ML IJ SOLN
10.0000 mg | Freq: Once | INTRAMUSCULAR | Status: AC
  Administered 2019-04-03: 10 mg via INTRAVENOUS
  Filled 2019-04-03: qty 1

## 2019-04-03 MED ORDER — SODIUM CHLORIDE 0.9 % IV BOLUS
1000.0000 mL | Freq: Once | INTRAVENOUS | Status: AC
  Administered 2019-04-03: 1000 mL via INTRAVENOUS

## 2019-04-03 NOTE — ED Provider Notes (Signed)
[facility] EMERGENCY DEPARTMENT Provider Note   CSN: [id number] Arrival date & time: 04/02/19  2153     History   Chief Complaint Chief Complaint  Patient presents with   Migraine    HPI Natalie Pham is a 18 y.o. female.     Patient presents to the emergency department for evaluation of migraine headache.  She reports that she has been having a posterior throbbing headache that radiates up to the vertex for the last several days.  She endorses light sensitivity and nausea but no vomiting.  No neck pain, stiffness or fever.  She has a history of migraines that are identical to the symptoms but has not had one for 6 months.  Patient reports that she was seen at another emergency department last week for abdominal pain.  She received multiple doses of morphine that caused her to become agitated and then received Ativan.  This caused her to require intubation and she was in the ICU for 2 days.  She is not complaining of the abdominal pain currently.     Past Medical History:  Diagnosis Date   Borderline personality disorder (HCC)    Intentional drug overdose (HCC)    Major depressive disorder    Migraines     Patient Active Problem List   Diagnosis Date Noted   Self-inflicted laceration of wrist 01/22/2019   Major depressive disorder, recurrent severe without psychotic features (HCC) 01/20/2019   Borderline personality disorder (HCC) 01/19/2019   Major depressive disorder, recurrent, severe without psychotic features (HCC) 01/19/2019   Diphenhydramine overdose, intentional self-harm, sequela (HCC) 01/01/2019    Past Surgical History:  Procedure Laterality Date   APPENDECTOMY       OB History   No obstetric history on file.      Home Medications    Prior to Admission medications   Medication Sig Start Date End Date Taking? Authorizing Provider  FLUoxetine (PROZAC) 40 MG capsule Take 1 capsule (40 mg total) by mouth daily. 01/22/19   Clapacs, Jackquline Denmark, MD   gabapentin (NEURONTIN) 600 MG tablet Take 0.5 tablets (300 mg total) by mouth 2 (two) times daily. 01/22/19   Clapacs, Jackquline Denmark, MD  levETIRAcetam (KEPPRA) 500 MG tablet Take 1 tablet (500 mg total) by mouth 2 (two) times daily. 01/22/19   Clapacs, Jackquline Denmark, MD  mirtazapine (REMERON) 15 MG tablet Take 1 tablet (15 mg total) by mouth at bedtime. 01/22/19   Clapacs, Jackquline Denmark, MD  OLANZapine (ZYPREXA) 10 MG tablet Take 1 tablet (10 mg total) by mouth at bedtime. 01/22/19   Clapacs, Jackquline Denmark, MD    Family History No family history on file.  Social History Social History   Tobacco Use   Smoking status: Current Every Day Smoker    Packs/day: 0.50    Types: Cigarettes   Smokeless tobacco: Never Used  Substance Use Topics   Alcohol use: Never    Frequency: Never   Drug use: Never     Allergies   Aripiprazole, Cod [fish allergy], and Lamotrigine   Review of Systems Review of Systems  Neurological: Positive for headaches.  All other systems reviewed and are negative.    Physical Exam Updated Vital Signs BP 134/87 (BP Location: Right Arm)    Pulse 61    Temp 98.5 F (36.9 C) (Oral)    Resp 18    SpO2 99%   Physical Exam Vitals signs and nursing note reviewed.  Constitutional:      General: She  is not in acute distress.    Appearance: Normal appearance. She is well-developed.  HENT:     Head: Normocephalic and atraumatic.     Right Ear: Hearing normal.     Left Ear: Hearing normal.     Nose: Nose normal.  Eyes:     Conjunctiva/sclera: Conjunctivae normal.     Pupils: Pupils are equal, round, and reactive to light.  Neck:     Musculoskeletal: Normal range of motion and neck supple.  Cardiovascular:     Rate and Rhythm: Regular rhythm.     Heart sounds: S1 normal and S2 normal. No murmur. No friction rub. No gallop.   Pulmonary:     Effort: Pulmonary effort is normal. No respiratory distress.     Breath sounds: Normal breath sounds.  Chest:     Chest wall: No tenderness.    Abdominal:     General: Bowel sounds are normal.     Palpations: Abdomen is soft.     Tenderness: There is no abdominal tenderness. There is no guarding or rebound. Negative signs include Murphy's sign and McBurney's sign.     Hernia: No hernia is present.  Musculoskeletal: Normal range of motion.  Skin:    General: Skin is warm and dry.     Findings: No rash.  Neurological:     Mental Status: She is alert and oriented to person, place, and time.     GCS: GCS eye subscore is 4. GCS verbal subscore is 5. GCS motor subscore is 6.     Cranial Nerves: No cranial nerve deficit.     Sensory: No sensory deficit.     Coordination: Coordination normal.  Psychiatric:        Speech: Speech normal.        Behavior: Behavior normal.        Thought Content: Thought content normal.      ED Treatments / Results  Labs (all labs ordered are listed, but only abnormal results are displayed) Labs Reviewed - No data to display  EKG None  Radiology Ct Head Wo Contrast  Result Date: 04/03/2019 CLINICAL DATA:  18 year old female with acute headache for 3 days. Nausea and light sensitivity. EXAM: CT HEAD WITHOUT CONTRAST TECHNIQUE: Contiguous axial images were obtained from the base of the skull through the vertex without intravenous contrast. COMPARISON:  Brain MRI 12/07/2018 and head CT 12/06/2018. FINDINGS: Brain: Cerebral volume remains normal. No midline shift, ventriculomegaly, mass effect, evidence of mass lesion, intracranial hemorrhage or evidence of cortically based acute infarction. Gray-white matter differentiation is within normal limits throughout the brain. Partially empty sella again suggested by CT, and there was some flattening of the pituitary gland on the prior MRI, although the bony sella also is deep. Vascular: No suspicious intracranial vascular hyperdensity. Skull: Negative. Sinuses/Orbits: Visualized paranasal sinuses and mastoids are stable and well pneumatized. Other: Visualized  orbits and scalp soft tissues are within normal limits. IMPRESSION: Stable non-contrast CT appearance of the brain, normal aside from partially empty sella which can be an anatomic variant but also has association with idiopathic intracranial hypertension (pseudotumor cerebri). Electronically Signed   By: Odessa FlemingH  Hall M.D.   On: 04/03/2019 02:14    Procedures Procedures (including critical care time)  Medications Ordered in ED Medications  sodium chloride 0.9 % bolus 1,000 mL (1,000 mLs Intravenous New Bag/Given 04/03/19 0120)  ketorolac (TORADOL) 30 MG/ML injection 30 mg (30 mg Intravenous Given 04/03/19 0133)  prochlorperazine (COMPAZINE) injection 10 mg (10 mg Intravenous  Given 04/03/19 0128)  diphenhydrAMINE (BENADRYL) injection 25 mg (25 mg Intravenous Given 04/03/19 0120)  dexamethasone (DECADRON) injection 10 mg (10 mg Intravenous Given 04/03/19 0122)     Initial Impression / Assessment and Plan / ED Course  I have reviewed the triage vital signs and the nursing notes.  Pertinent labs & imaging results that were available during my care of the patient were reviewed by me and considered in my medical decision making (see chart for details).        Patient sounds like a typical migraine headache.  She reports that her headache currently is similar to the migraine she has had in the past.  However she recently was intubated because of oversedation from medication at outside hospital and therefore underwent CT head to further evaluate.  CT scan is unchanged from previous, no acute pathology.  Patient treated with migraine cocktail with improvement.  Final Clinical Impressions(s) / ED Diagnoses   Final diagnoses:  Migraine without status migrainosus, not intractable, unspecified migraine type    ED Discharge Orders    None       Payden Docter, Gwenyth Allegra, MD 04/03/19 0222

## 2019-04-03 NOTE — ED Notes (Signed)
Returned from ct scan 

## 2021-05-14 IMAGING — CT CT HEAD WITHOUT CONTRAST
3 series · 15 of 45 positions shown, 18 images · non-contrast
Comparison: None.

CLINICAL DATA: 17 y/o  F; Seizure, new, nontraumatic, 18-40 yrs.

EXAM:
CT HEAD WITHOUT CONTRAST
TECHNIQUE: Contiguous axial images were obtained from the base of the skull
through the vertex without intravenous contrast.

[Series 2: head wo · axial · 0.41mm/px · z∈[+114,+229]mm · 9 of 28 slices shown, 12 images]
[im 3/28  brain]
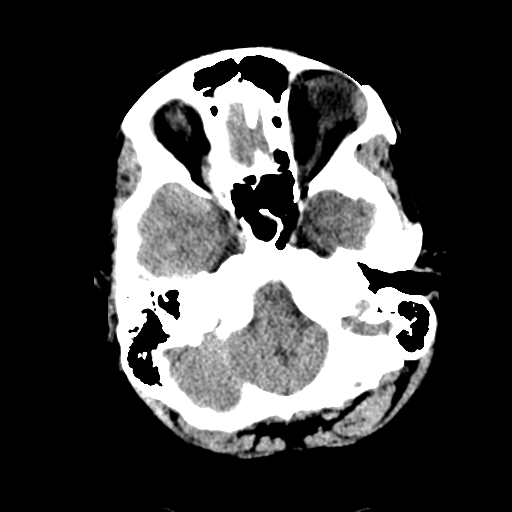
[im 3/28  bone]
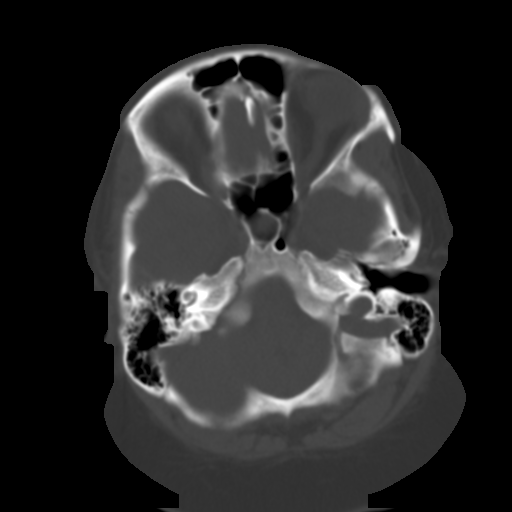
[im 6/28  brain]
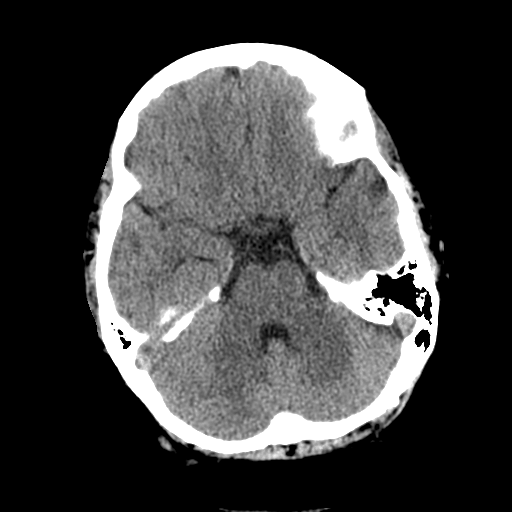
[im 9/28  brain]
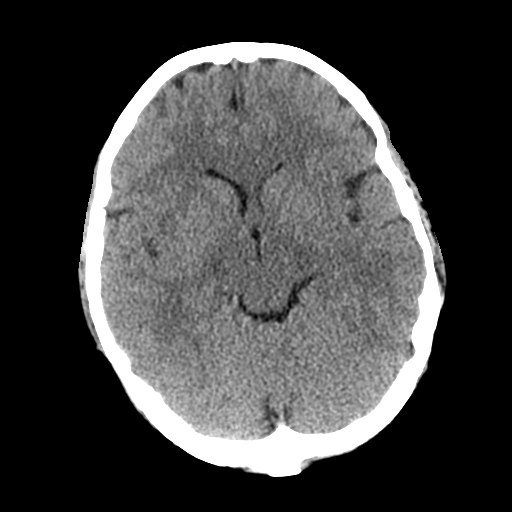
[im 12/28  brain]
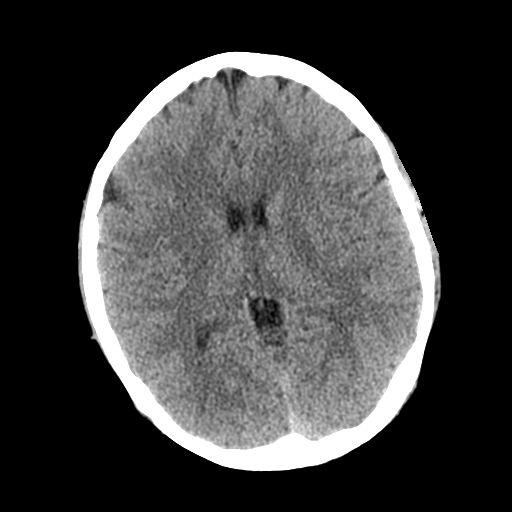
[im 15/28  brain]
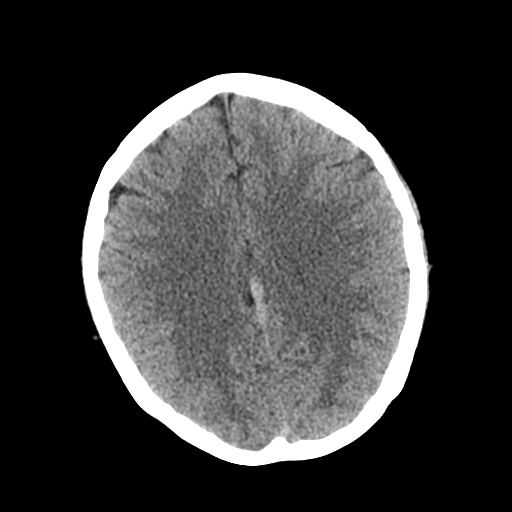
[im 15/28  bone]
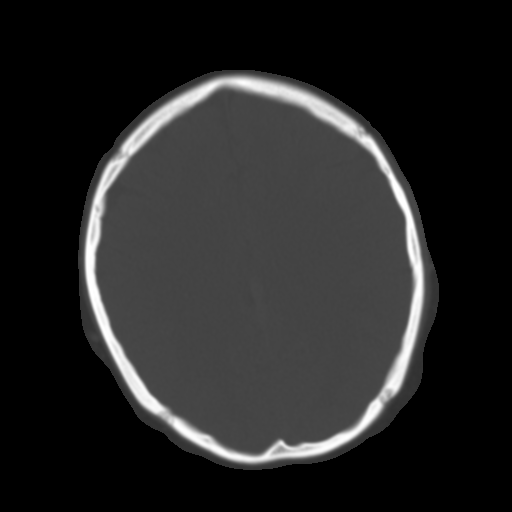
[im 17/28  brain]
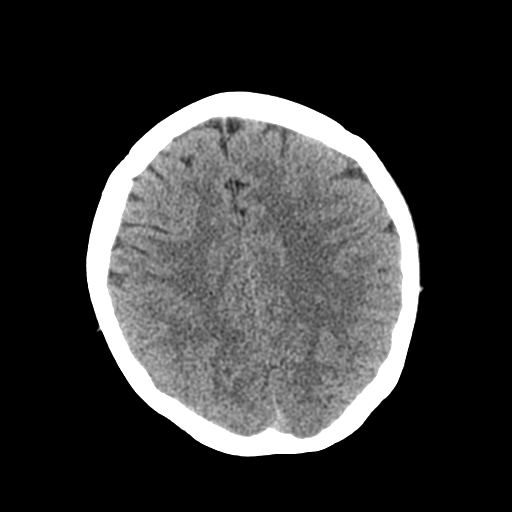
[im 20/28  brain]
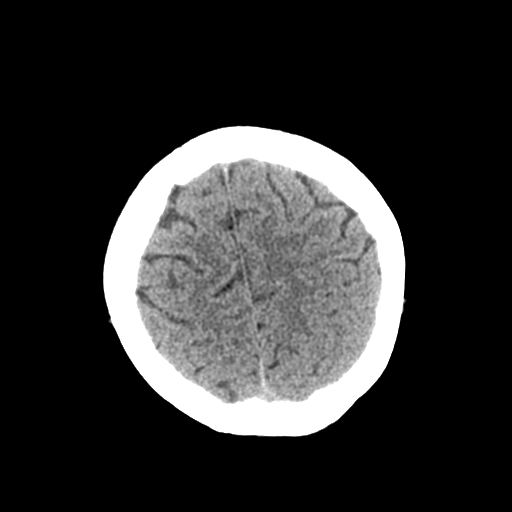
[im 23/28  brain]
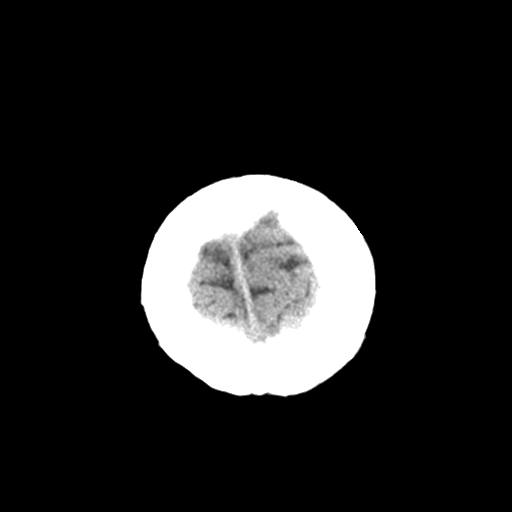
[im 26/28  brain]
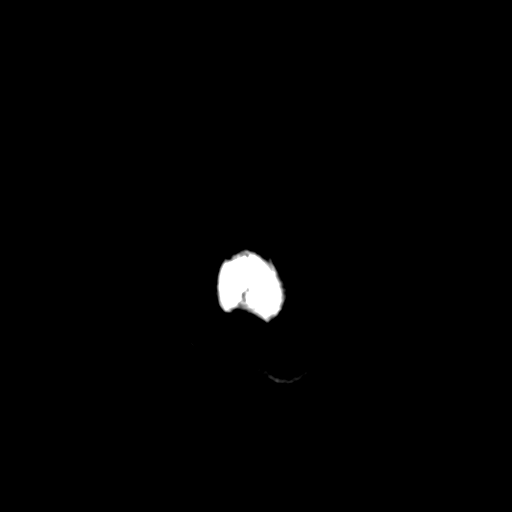
[im 26/28  bone]
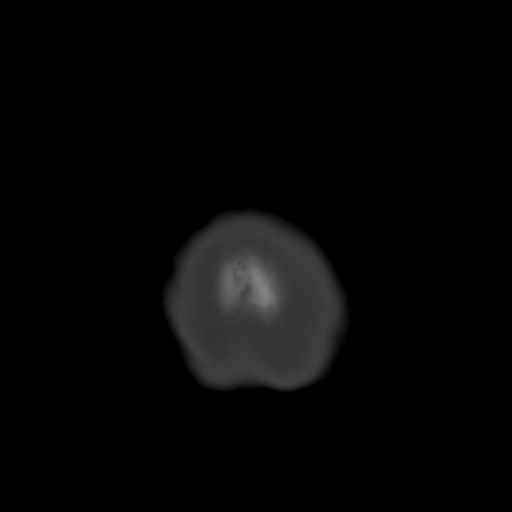

[Series 4: coronal soft tissue · coronal · 0.28mm/px · 3 of 61 slices shown]
[im 21/61  brain]
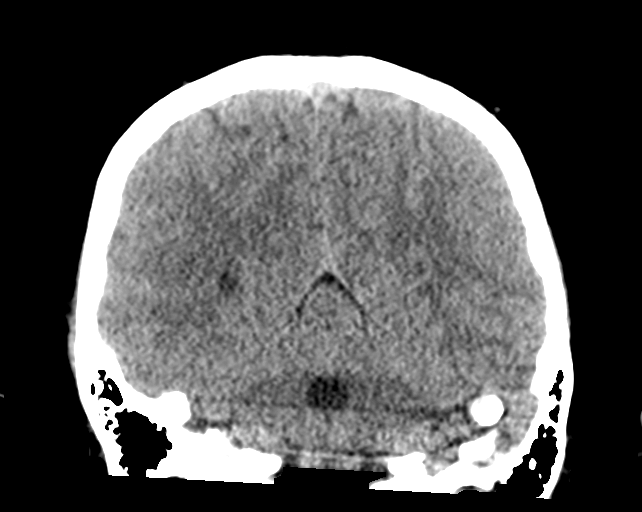
[im 27/61  brain]
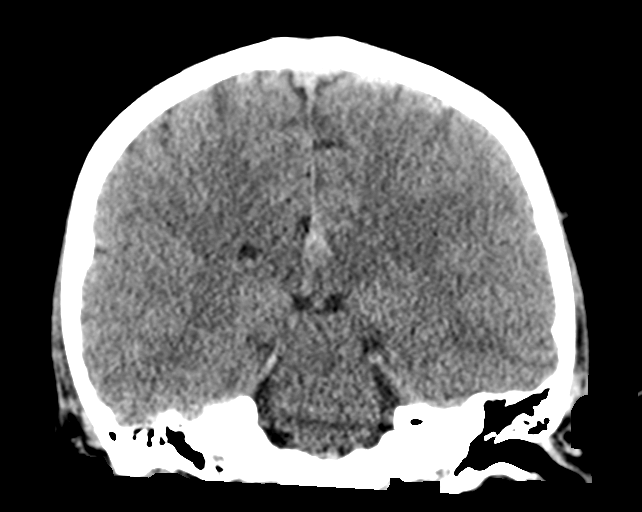
[im 34/61  brain]
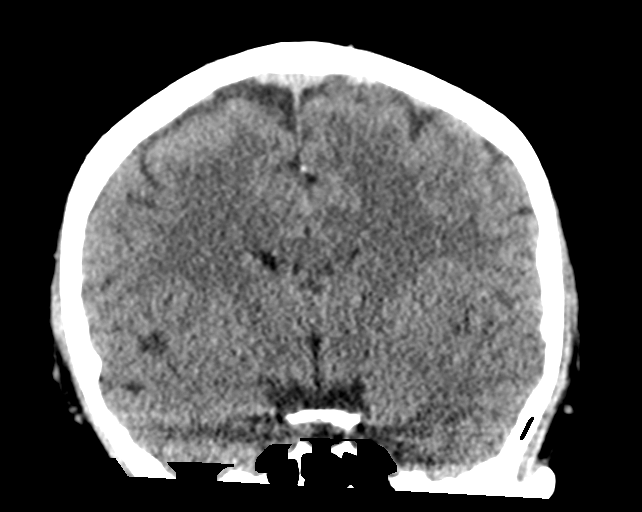

[Series 5: sagittal soft tissue · sagittal · 0.28mm/px · 3 of 54 slices shown]
[im 18/54  brain]
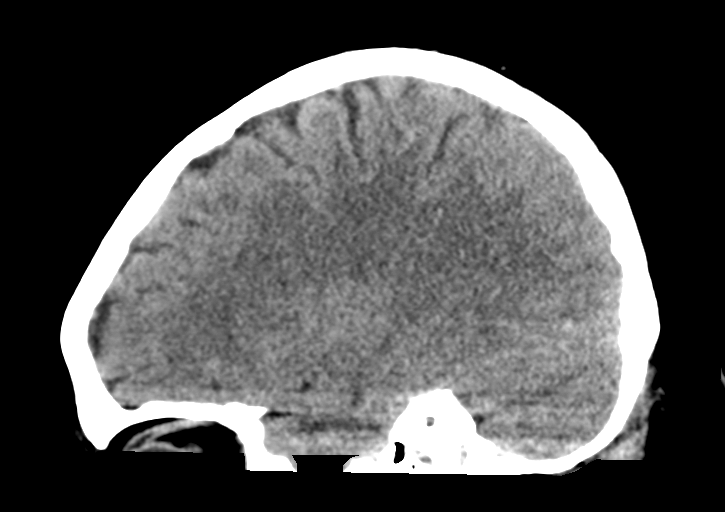
[im 27/54  brain]
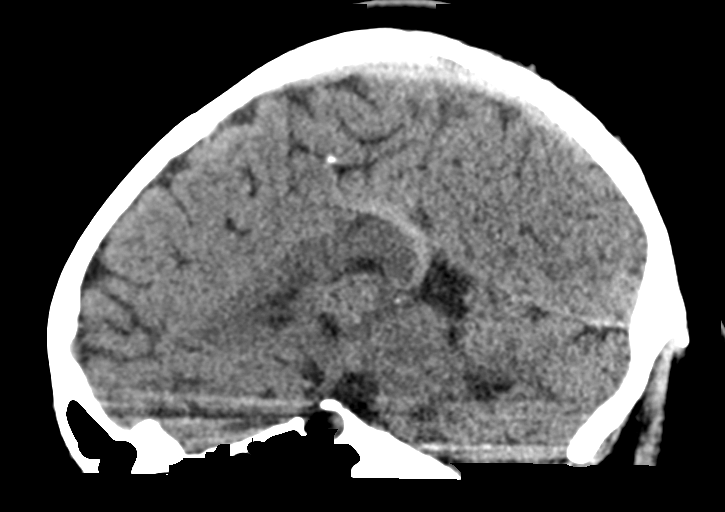
[im 36/54  brain]
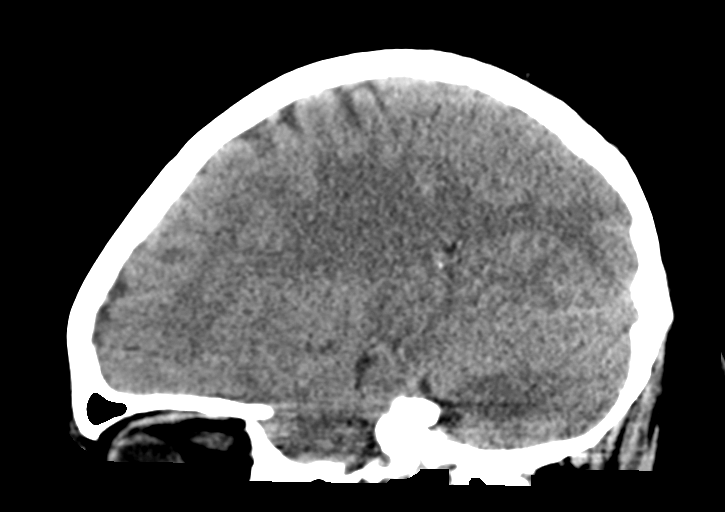

[15 of 45 positions shown; findings below may reference images not displayed]

FINDINGS: Brain: No evidence of acute infarction, hemorrhage, hydrocephalus,
extra-axial collection or mass lesion/mass effect. Empty sella
turcica.

Vascular: No hyperdense vessel or unexpected calcification.

Skull: Normal. Negative for fracture or focal lesion.

Sinuses/Orbits: No acute finding.

Other: None.
IMPRESSION: No acute intracranial abnormality identified. Empty sella turcica.
Otherwise unremarkable CT of the head.
